# Patient Record
Sex: Male | Born: 1970 | Race: Black or African American | Hispanic: No | Marital: Married | Smoking: Never smoker
Health system: Southern US, Community
[De-identification: ages and names within clinical notes are randomized; demographics above are authoritative.]

## PROBLEM LIST (undated history)

## (undated) DIAGNOSIS — I10 Essential (primary) hypertension: Secondary | ICD-10-CM

## (undated) HISTORY — DX: Essential (primary) hypertension: I10

---

## 1993-09-09 HISTORY — PX: INGUINAL HERNIA REPAIR: SUR1180

## 2007-09-12 ENCOUNTER — Emergency Department (HOSPITAL_COMMUNITY): Admission: EM | Admit: 2007-09-12 | Discharge: 2007-09-12 | Payer: Self-pay | Admitting: *Deleted
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
## 2011-02-19 ENCOUNTER — Emergency Department: Payer: Self-pay | Admitting: Emergency Medicine

## 2013-11-12 ENCOUNTER — Encounter: Payer: Self-pay | Admitting: Podiatry

## 2013-11-12 ENCOUNTER — Ambulatory Visit: Payer: 59 | Admitting: Podiatry

## 2013-11-12 VITALS — BP 129/90 | HR 80 | Resp 16 | Ht 73.0 in | Wt 300.0 lb

## 2013-11-12 DIAGNOSIS — L600 Ingrowing nail: Secondary | ICD-10-CM

## 2013-11-12 NOTE — Progress Notes (Signed)
   Subjective:    Patient ID: Daniel Cummings, male    DOB: 12/04/1970, 43 y.o.   MRN: [id number]  HPI Comments: A couple of weeks ago i had a infected ingrown toenail went to urgent care and was put on a antibiotic.  The infection cleared up , i just want him to take a look at it. Left great lateral corner was the infected toe . Its a little tender but it is not like it was      Review of Systems  All other systems reviewed and are negative.       Objective:   Physical Exam        Assessment & Plan:

## 2013-11-12 NOTE — Patient Instructions (Signed)

## 2013-11-12 NOTE — Progress Notes (Signed)
Subjective:     Patient ID: Daniel Cummings, male   DOB: [birth year], 43 y.o.   MRN: [id number]  HPI patient presents with painful ingrown toenail left big toe that he has trims himself and there was drainage and odor that is no longer occurring but it has left him with a chronic ingrown toenail   Review of Systems  All other systems reviewed and are negative.       Objective:   Physical Exam  Nursing note and vitals reviewed. Constitutional: He is oriented to person, place, and time.  Cardiovascular: Intact distal pulses.   Musculoskeletal: Normal range of motion.  Neurological: He is oriented to person, place, and time.  Skin: Skin is warm.   neurovascular status intact with muscle strength adequate and no range of motion loss and no equinus condition noted. Patient has good Fill time and the digits are warm with incurvated left hallux lateral border that is painful when pressed and difficult for him to cut     Assessment:     Ingrown toenail deformity left hallux lateral border chronic nature    Plan:     H&P performed condition discussed and recommended permanent removal of the nail quarter. Patient wants procedure and I explained risk and today infiltrated 60 mg Xylocaine Marcaine mixture and remove the lateral quarter exposed matrix applied 3 applications of phenol 30 seconds followed by alcohol lavaged and sterile dressing. Instructed on soaks and reappoint

## 2016-09-08 DIAGNOSIS — L600 Ingrowing nail: Secondary | ICD-10-CM | POA: Diagnosis not present

## 2016-09-23 DIAGNOSIS — L03031 Cellulitis of right toe: Secondary | ICD-10-CM | POA: Diagnosis not present

## 2017-05-23 DIAGNOSIS — B999 Unspecified infectious disease: Secondary | ICD-10-CM | POA: Diagnosis not present

## 2017-06-19 DIAGNOSIS — Z6841 Body Mass Index (BMI) 40.0 and over, adult: Secondary | ICD-10-CM | POA: Diagnosis not present

## 2017-06-19 DIAGNOSIS — N501 Vascular disorders of male genital organs: Secondary | ICD-10-CM | POA: Diagnosis not present

## 2017-06-19 DIAGNOSIS — I10 Essential (primary) hypertension: Secondary | ICD-10-CM | POA: Diagnosis not present

## 2017-08-22 ENCOUNTER — Ambulatory Visit: Payer: Self-pay | Admitting: Nurse Practitioner

## 2017-09-01 ENCOUNTER — Other Ambulatory Visit: Payer: Self-pay | Admitting: Internal Medicine

## 2017-09-05 ENCOUNTER — Other Ambulatory Visit: Payer: Self-pay

## 2017-09-05 MED ORDER — HYDROCHLOROTHIAZIDE 12.5 MG PO TABS: 12.5000 mg | ORAL_TABLET | Freq: Every day | ORAL | 3 refills | Status: DC

## 2017-10-19 ENCOUNTER — Other Ambulatory Visit: Payer: Self-pay | Admitting: Internal Medicine

## 2017-10-20 DIAGNOSIS — M791 Myalgia, unspecified site: Secondary | ICD-10-CM | POA: Diagnosis not present

## 2017-10-20 DIAGNOSIS — K5289 Other specified noninfective gastroenteritis and colitis: Secondary | ICD-10-CM | POA: Diagnosis not present

## 2017-11-26 ENCOUNTER — Other Ambulatory Visit: Payer: Self-pay

## 2017-11-26 MED ORDER — VERAPAMIL HCL ER 180 MG PO TBCR: 180.0000 mg | EXTENDED_RELEASE_TABLET | Freq: Every day | ORAL | 1 refills | Status: DC

## 2017-12-16 ENCOUNTER — Other Ambulatory Visit: Payer: Self-pay | Admitting: Nurse Practitioner

## 2017-12-16 DIAGNOSIS — N529 Male erectile dysfunction, unspecified: Secondary | ICD-10-CM

## 2017-12-16 DIAGNOSIS — I10 Essential (primary) hypertension: Secondary | ICD-10-CM

## 2017-12-16 MED ORDER — HYDROCHLOROTHIAZIDE 12.5 MG PO TABS: 12.5000 mg | ORAL_TABLET | Freq: Every day | ORAL | 1 refills | Status: DC

## 2017-12-16 MED ORDER — VERAPAMIL HCL ER 180 MG PO TBCR: 180.0000 mg | EXTENDED_RELEASE_TABLET | Freq: Every day | ORAL | 1 refills | Status: DC

## 2017-12-16 MED ORDER — SILDENAFIL CITRATE 20 MG PO TABS: 20.0000 mg | ORAL_TABLET | Freq: Every day | ORAL | 1 refills | Status: DC | PRN

## 2017-12-16 NOTE — Progress Notes (Signed)
Refilled prescriptions for verapammil, HCTZ, and sildinafil for 30 days with 1 refill. Will need to be seen prior to any furhter refills. Sent all to [facility] [address].

## 2017-12-22 ENCOUNTER — Telehealth: Payer: Self-pay

## 2017-12-22 NOTE — Telephone Encounter (Signed)
Patient has been advised that Heather refilled his prescriptions for verapammil, HCTZ, and sildinafil for 30 days with 1 refill. Will need to be seen prior to any furhter refills. Sent all to [facility] [address]. Tat

## 2018-03-06 ENCOUNTER — Ambulatory Visit: Payer: 59 | Admitting: Nurse Practitioner

## 2018-03-06 ENCOUNTER — Encounter: Payer: Self-pay | Admitting: Nurse Practitioner

## 2018-03-06 VITALS — BP 146/112 | HR 102 | Resp 16 | Ht 73.0 in | Wt 318.4 lb

## 2018-03-06 DIAGNOSIS — E291 Testicular hypofunction: Secondary | ICD-10-CM

## 2018-03-06 DIAGNOSIS — Z0001 Encounter for general adult medical examination with abnormal findings: Secondary | ICD-10-CM

## 2018-03-06 DIAGNOSIS — E668 Other obesity: Secondary | ICD-10-CM

## 2018-03-06 DIAGNOSIS — E538 Deficiency of other specified B group vitamins: Secondary | ICD-10-CM

## 2018-03-06 DIAGNOSIS — I10 Essential (primary) hypertension: Secondary | ICD-10-CM

## 2018-03-06 DIAGNOSIS — Z125 Encounter for screening for malignant neoplasm of prostate: Secondary | ICD-10-CM

## 2018-03-06 MED ORDER — CYANOCOBALAMIN 1000 MCG/ML IJ SOLN
1000.0000 ug | Freq: Once | INTRAMUSCULAR | Status: AC
  Administered 2018-03-06: 1000 ug via INTRAMUSCULAR

## 2018-03-06 MED ORDER — HYDROCHLOROTHIAZIDE 12.5 MG PO TABS: 12.5000 mg | ORAL_TABLET | Freq: Every day | ORAL | 5 refills | Status: DC

## 2018-03-06 MED ORDER — VERAPAMIL HCL ER 180 MG PO TBCR: 180.0000 mg | EXTENDED_RELEASE_TABLET | Freq: Every day | ORAL | 5 refills | Status: DC

## 2018-03-06 NOTE — Progress Notes (Signed)
[facility] [address] [place], [place] [address]  Internal MEDICINE  Office Visit Note  Patient Name: Daniel Cummings  [id number]  [id number]  Date of Service: 03/27/2018   Pt is here for routine follow up.   Chief Complaint  Patient presents with  . Medication Refill    pt want to get weight loss medication. and B12 shot  . Hypertension    Hypertension  This is a chronic problem. The current episode started more than 1 year ago. The problem has been waxing and waning since onset. The problem is resistant. Pertinent negatives include no chest pain, headaches, neck pain, palpitations or shortness of breath. Risk factors for coronary artery disease include male gender and obesity. Past treatments include angiotensin blockers and diuretics. The current treatment provides mild improvement. Compliance problems include diet and exercise.        Current Medication: Outpatient Encounter Medications as of 03/06/2018  Medication Sig  . hydrochlorothiazide (HYDRODIURIL) 12.5 MG tablet Take 1 tablet (12.5 mg total) by mouth daily.  . verapamil (CALAN-SR) 180 MG CR tablet Take 1 tablet (180 mg total) by mouth daily.  . [DISCONTINUED] hydrochlorothiazide (HYDRODIURIL) 12.5 MG tablet Take 1 tablet (12.5 mg total) by mouth daily.  . [DISCONTINUED] sildenafil (REVATIO) 20 MG tablet Take 1 tablet (20 mg total) by mouth daily as needed.  . [DISCONTINUED] verapamil (CALAN-SR) 180 MG CR tablet Take 1 tablet (180 mg total) by mouth daily.  . [EXPIRED] cyanocobalamin ((VITAMIN B-12)) injection 1,000 mcg    No facility-administered encounter medications on file as of 03/06/2018.     Surgical History: History reviewed. No pertinent surgical history.  Medical History: History reviewed. No pertinent past medical history.  Family History: History reviewed. No pertinent family history.  Social History   Socioeconomic History  . Marital status: Married    Spouse name: Not on file   . Number of children: Not on file  . Years of education: Not on file  . Highest education level: Not on file  Occupational History  . Not on file  Social Needs  . Financial resource strain: Not on file  . Food insecurity:    Worry: Not on file    Inability: Not on file  . Transportation needs:    Medical: Not on file    Non-medical: Not on file  Tobacco Use  . Smoking status: Never Smoker  . Smokeless tobacco: Never Used  Substance and Sexual Activity  . Alcohol use: No  . Drug use: No  . Sexual activity: Not on file  Lifestyle  . Physical activity:    Days per week: Not on file    Minutes per session: Not on file  . Stress: Not on file  Relationships  . Social connections:    Talks on phone: Not on file    Gets together: Not on file    Attends religious service: Not on file    Active member of club or organization: Not on file    Attends meetings of clubs or organizations: Not on file    Relationship status: Not on file  . Intimate partner violence:    Fear of current or ex partner: Not on file    Emotionally abused: Not on file    Physically abused: Not on file    Forced sexual activity: Not on file  Other Topics Concern  . Not on file  Social History Narrative  . Not on file      Review of  Systems  Constitutional: Negative for activity change, chills, fatigue and unexpected weight change.  HENT: Negative for congestion, postnasal drip, rhinorrhea, sneezing and sore throat.   Eyes: Negative.  Negative for redness.  Respiratory: Negative for cough, chest tightness, shortness of breath and wheezing.   Cardiovascular: Negative for chest pain and palpitations.       Bp elevated today  Gastrointestinal: Negative for abdominal pain, constipation, diarrhea, nausea and vomiting.  Endocrine: Negative for cold intolerance, heat intolerance, polydipsia, polyphagia and polyuria.  Genitourinary: Negative for dysuria and frequency.  Musculoskeletal: Negative for  arthralgias, back pain, joint swelling and neck pain.  Skin: Negative for rash.  Allergic/Immunologic: Negative for environmental allergies.  Neurological: Negative for dizziness, tremors, numbness and headaches.  Hematological: Negative for adenopathy. Does not bruise/bleed easily.  Psychiatric/Behavioral: Negative for behavioral problems (Depression), dysphoric mood, sleep disturbance and suicidal ideas. The patient is not nervous/anxious.    Today's Vitals   03/06/18 1414  BP: (!) 146/112  Pulse: (!) 102  Resp: 16  SpO2: 94%  Weight: (!) 318 lb 6.4 oz (144.4 kg)  Height: 6\' 1"  (1.854 m)    Physical Exam  Constitutional: He is oriented to person, place, and time. He appears well-developed and well-nourished. No distress.  HENT:  Head: Normocephalic and atraumatic.  Nose: Nose normal.  Mouth/Throat: Oropharynx is clear and moist. No oropharyngeal exudate.  Eyes: Pupils are equal, round, and reactive to light. Conjunctivae and EOM are normal.  Neck: Normal range of motion. Neck supple. No JVD present. Carotid bruit is not present. No tracheal deviation present. No thyromegaly present.  Cardiovascular: Normal rate, regular rhythm and normal heart sounds. Exam reveals no gallop and no friction rub.  No murmur heard. Pulmonary/Chest: Effort normal and breath sounds normal. No respiratory distress. He has no wheezes. He has no rales. He exhibits no tenderness.  Abdominal: Soft. Bowel sounds are normal. There is no tenderness.  Musculoskeletal: Normal range of motion.  Lymphadenopathy:    He has no cervical adenopathy.  Neurological: He is alert and oriented to person, place, and time. No cranial nerve deficit.  Skin: Skin is warm and dry. Capillary refill takes less than 2 seconds. He is not diaphoretic.  Psychiatric: He has a normal mood and affect. His behavior is normal. Judgment and thought content normal.  Nursing note and vitals reviewed.  Assessment/Plan:  1. Essential  hypertension Restart verapamil CR 180mg  and HCTZ 12.5mg , both daily. Avoid excess salt intake and increase water. Recommend regular cardiovascular exercise.  - CBC with Differential/Platelet - Comprehensive metabolic panel - Lipid panel - TSH - hydrochlorothiazide (HYDRODIURIL) 12.5 MG tablet; Take 1 tablet (12.5 mg total) by mouth daily.  Dispense: 30 tablet; Refill: 5 - verapamil (CALAN-SR) 180 MG CR tablet; Take 1 tablet (180 mg total) by mouth daily.  Dispense: 30 tablet; Refill: 5  2. Testicular hypofunction Check testosterone level and treat as indicated  3. Moderate obesity Recommend following 1500 calorie diet and increased exercise. Revaluate at next visit. Consider appetite suppressant  4. B12 deficiency - cyanocobalamin ((VITAMIN B-12)) injection 1,000 mcg  5. Encounter for general adult medical examination with abnormal findings - Comprehensive metabolic panel - TSH  6. Screening for prostate cancer - PSA   General Counseling: gianmarco roye understanding of the findings of todays visit and agrees with plan of treatment. I have discussed any further diagnostic evaluation that may be needed or ordered today. We also reviewed his medications today. he has been encouraged to call the office with  any questions or concerns that should arise related to todays visit.    Counseling:  Hypertension Counseling:   The following hypertensive lifestyle modification were recommended and discussed:  1. Limiting alcohol intake to less than 1 oz/day of ethanol:(24 oz of beer or 8 oz of wine or 2 oz of 100-proof whiskey). 2. Take baby ASA 81 mg daily. 3. Importance of regular aerobic exercise and losing weight. 4. Reduce dietary saturated fat and cholesterol intake for overall cardiovascular health. 5. Maintaining adequate dietary potassium, calcium, and magnesium intake. 6. Regular monitoring of the blood pressure. 7. Reduce sodium intake to less than 100 mmol/day (less than  2.3 gm of sodium or less than 6 gm of sodium choride)    This patient was seen by Vincent GrosHeather Coleson Kant FNP Collaboration with Dr Lyndon CodeFozia M Khan as a part of collaborative care agreement  Orders Placed This Encounter  Procedures  . PSA  . CBC with Differential/Platelet  . Comprehensive metabolic panel  . Lipid panel  . TSH    Meds ordered this encounter  Medications  . hydrochlorothiazide (HYDRODIURIL) 12.5 MG tablet    Sig: Take 1 tablet (12.5 mg total) by mouth daily.    Dispense:  30 tablet    Refill:  5    Order Specific Question:   Supervising Provider    Answer:   Lyndon CodeKHAN, FOZIA M [[id number]]  . verapamil (CALAN-SR) 180 MG CR tablet    Sig: Take 1 tablet (180 mg total) by mouth daily.    Dispense:  30 tablet    Refill:  5    Order Specific Question:   Supervising Provider    Answer:   Lyndon CodeKHAN, FOZIA M [[id number]]  . cyanocobalamin ((VITAMIN B-12)) injection 1,000 mcg    Time spent: 3215 Minutes   Dr Lyndon CodeFozia M Khan Internal medicine

## 2018-03-09 ENCOUNTER — Other Ambulatory Visit: Payer: Self-pay

## 2018-03-10 ENCOUNTER — Other Ambulatory Visit: Payer: Self-pay

## 2018-03-10 DIAGNOSIS — N529 Male erectile dysfunction, unspecified: Secondary | ICD-10-CM

## 2018-03-10 MED ORDER — SILDENAFIL CITRATE 20 MG PO TABS: 20.0000 mg | ORAL_TABLET | Freq: Every day | ORAL | 1 refills | Status: DC | PRN

## 2018-03-19 ENCOUNTER — Other Ambulatory Visit: Payer: Self-pay | Admitting: Nurse Practitioner

## 2018-03-19 DIAGNOSIS — Z0001 Encounter for general adult medical examination with abnormal findings: Secondary | ICD-10-CM | POA: Diagnosis not present

## 2018-03-19 DIAGNOSIS — E782 Mixed hyperlipidemia: Secondary | ICD-10-CM | POA: Diagnosis not present

## 2018-03-19 DIAGNOSIS — I10 Essential (primary) hypertension: Secondary | ICD-10-CM | POA: Diagnosis not present

## 2018-03-20 LAB — TESTOSTERONE: Testosterone: 177 ng/dL — ABNORMAL LOW (ref 264–916)

## 2018-03-20 LAB — LIPID PANEL W/O CHOL/HDL RATIO
CHOLESTEROL TOTAL: 154 mg/dL (ref 100–199)
HDL: 40 mg/dL (ref >39)
LDL CALC: 69 mg/dL (ref 0–99)
Triglycerides: 227 mg/dL — ABNORMAL HIGH (ref 0–149)
VLDL Cholesterol Cal: 45 mg/dL — ABNORMAL HIGH (ref 5–40)

## 2018-03-20 LAB — COMPREHENSIVE METABOLIC PANEL
A/G RATIO: 1.1 — AB (ref 1.2–2.2)
ALT: 30 IU/L (ref 0–44)
AST: 30 IU/L (ref 0–40)
Albumin: 4.1 g/dL (ref 3.5–5.5)
Alkaline Phosphatase: 105 IU/L (ref 39–117)
BUN/Creatinine Ratio: 13 (ref 9–20)
BUN: 16 mg/dL (ref 6–24)
Bilirubin Total: 0.6 mg/dL (ref 0.0–1.2)
CALCIUM: 9.5 mg/dL (ref 8.7–10.2)
CO2: 24 mmol/L (ref 20–29)
Chloride: 103 mmol/L (ref 96–106)
Creatinine, Ser: 1.27 mg/dL (ref 0.76–1.27)
GFR calc Af Amer: 77 mL/min/{1.73_m2} (ref >59)
GFR, EST NON AFRICAN AMERICAN: 66 mL/min/{1.73_m2} (ref >59)
GLOBULIN, TOTAL: 3.7 g/dL (ref 1.5–4.5)
Glucose: 105 mg/dL — ABNORMAL HIGH (ref 65–99)
POTASSIUM: 4.2 mmol/L (ref 3.5–5.2)
SODIUM: 142 mmol/L (ref 134–144)
TOTAL PROTEIN: 7.8 g/dL (ref 6.0–8.5)

## 2018-03-20 LAB — PSA: Prostate Specific Ag, Serum: 0.5 ng/mL (ref 0.0–4.0)

## 2018-03-20 LAB — CBC
HEMOGLOBIN: 13.9 g/dL (ref 13.0–17.7)
Hematocrit: 40.9 % (ref 37.5–51.0)
MCH: 27.6 pg (ref 26.6–33.0)
MCHC: 34 g/dL (ref 31.5–35.7)
MCV: 81 fL (ref 79–97)
Platelets: 301 10*3/uL (ref 150–450)
RBC: 5.04 x10E6/uL (ref 4.14–5.80)
RDW: 14.7 % (ref 12.3–15.4)
WBC: 8 10*3/uL (ref 3.4–10.8)

## 2018-03-20 LAB — HGB A1C W/O EAG: Hgb A1c MFr Bld: 6 % — ABNORMAL HIGH (ref 4.8–5.6)

## 2018-03-20 LAB — TSH: TSH: 2.12 u[IU]/mL (ref 0.450–4.500)

## 2018-03-27 ENCOUNTER — Ambulatory Visit: Payer: 59 | Admitting: Adult Health

## 2018-03-27 ENCOUNTER — Encounter: Payer: Self-pay | Admitting: Adult Health

## 2018-03-27 VITALS — BP 130/98 | HR 96 | Resp 16 | Ht 73.0 in | Wt 316.2 lb

## 2018-03-27 DIAGNOSIS — Z79890 Hormone replacement therapy (postmenopausal): Secondary | ICD-10-CM

## 2018-03-27 DIAGNOSIS — E668 Other obesity: Secondary | ICD-10-CM | POA: Insufficient documentation

## 2018-03-27 DIAGNOSIS — I10 Essential (primary) hypertension: Secondary | ICD-10-CM

## 2018-03-27 DIAGNOSIS — E291 Testicular hypofunction: Secondary | ICD-10-CM | POA: Diagnosis not present

## 2018-03-27 DIAGNOSIS — E538 Deficiency of other specified B group vitamins: Secondary | ICD-10-CM | POA: Insufficient documentation

## 2018-03-27 DIAGNOSIS — Z125 Encounter for screening for malignant neoplasm of prostate: Secondary | ICD-10-CM | POA: Insufficient documentation

## 2018-03-27 MED ORDER — TESTOSTERONE 20.25 MG/ACT (1.62%) TD GEL: 1.0000 "application " | Freq: Every day | TRANSDERMAL | 0 refills | Status: DC

## 2018-03-27 NOTE — Progress Notes (Signed)
[facility] [address] [place], [place] [address]  Internal MEDICINE  Office Visit Note  Patient Name: Daniel Cummings  [id number] 22, 2072  [id number]  Date of Service: 03/29/2018  Chief Complaint  Patient presents with  . Weight Loss  . Hypertension    check bp     HPI Pt here for follow up to lab work.  He is requesting medication for weight loss.  At his last visit his blood pressure was elevated.  He reports he had consumed energy drinks before coming to the office. He is concerned about his testosterone level.     Current Medication: Outpatient Encounter Medications as of 03/27/2018  Medication Sig  . hydrochlorothiazide (HYDRODIURIL) 12.5 MG tablet Take 1 tablet (12.5 mg total) by mouth daily.  . sildenafil (REVATIO) 20 MG tablet Take 1 tablet (20 mg total) by mouth daily as needed.  . Testosterone (ANDROGEL PUMP) 20.25 MG/ACT (1.62%) GEL Place 1 application onto the skin daily.  . verapamil (CALAN-SR) 180 MG CR tablet Take 1 tablet (180 mg total) by mouth daily.   No facility-administered encounter medications on file as of 03/27/2018.     Surgical History: History reviewed. No pertinent surgical history.  Medical History: History reviewed. No pertinent past medical history.  Family History: History reviewed. No pertinent family history.  Social History   Socioeconomic History  . Marital status: Married    Spouse name: Not on file  . Number of children: Not on file  . Years of education: Not on file  . Highest education level: Not on file  Occupational History  . Not on file  Social Needs  . Financial resource strain: Not on file  . Food insecurity:    Worry: Not on file    Inability: Not on file  . Transportation needs:    Medical: Not on file    Non-medical: Not on file  Tobacco Use  . Smoking status: Never Smoker  . Smokeless tobacco: Never Used  Substance and Sexual Activity  . Alcohol use: No  . Drug use: No  . Sexual activity: Not on  file  Lifestyle  . Physical activity:    Days per week: Not on file    Minutes per session: Not on file  . Stress: Not on file  Relationships  . Social connections:    Talks on phone: Not on file    Gets together: Not on file    Attends religious service: Not on file    Active member of club or organization: Not on file    Attends meetings of clubs or organizations: Not on file    Relationship status: Not on file  . Intimate partner violence:    Fear of current or ex partner: Not on file    Emotionally abused: Not on file    Physically abused: Not on file    Forced sexual activity: Not on file  Other Topics Concern  . Not on file  Social History Narrative  . Not on file      Review of Systems  Constitutional: Negative.  Negative for chills, fatigue and unexpected weight change.  HENT: Negative.  Negative for congestion, rhinorrhea, sneezing and sore throat.   Eyes: Negative for redness.  Respiratory: Negative.  Negative for cough, chest tightness and shortness of breath.   Cardiovascular: Negative.  Negative for chest pain and palpitations.  Gastrointestinal: Negative.  Negative for abdominal pain, constipation, diarrhea, nausea and vomiting.  Endocrine: Negative.   Genitourinary: Negative.  Negative for dysuria and frequency.  Musculoskeletal: Negative.  Negative for arthralgias, back pain, joint swelling and neck pain.  Skin: Negative.  Negative for rash.  Allergic/Immunologic: Negative.   Neurological: Negative.  Negative for tremors and numbness.  Hematological: Negative for adenopathy. Does not bruise/bleed easily.  Psychiatric/Behavioral: Negative.  Negative for behavioral problems, sleep disturbance and suicidal ideas. The patient is not nervous/anxious.     Vital Signs: BP (!) 130/98   Pulse 96   Resp 16   Ht 6\' 1"  (1.854 m)   Wt (!) 316 lb 3.2 oz (143.4 kg)   SpO2 97%   BMI 41.72 kg/m    Physical Exam  Constitutional: He is oriented to person, place,  and time. He appears well-developed and well-nourished. No distress.  HENT:  Head: Normocephalic and atraumatic.  Mouth/Throat: Oropharynx is clear and moist. No oropharyngeal exudate.  Eyes: Pupils are equal, round, and reactive to light. EOM are normal.  Neck: Normal range of motion. Neck supple. No JVD present. No tracheal deviation present. No thyromegaly present.  Cardiovascular: Normal rate, regular rhythm and normal heart sounds. Exam reveals no gallop and no friction rub.  No murmur heard. Pulmonary/Chest: Effort normal and breath sounds normal. No respiratory distress. He has no wheezes. He has no rales. He exhibits no tenderness.  Abdominal: Soft. There is no tenderness. There is no guarding.  Musculoskeletal: Normal range of motion.  Lymphadenopathy:    He has no cervical adenopathy.  Neurological: He is alert and oriented to person, place, and time. No cranial nerve deficit.  Skin: Skin is warm and dry. He is not diaphoretic.  Psychiatric: He has a normal mood and affect. His behavior is normal. Judgment and thought content normal.  Nursing note and vitals reviewed.  Assessment/Plan: 1. Testicular hypofunction - Level decreased.  Will start on treatment, however was educated about worsening lipid profile and elevated ferritin and hg due to testosterone injection, pt will need serial levels, pt will also need prolactin level  - Testosterone (ANDROGEL PUMP) 20.25 MG/ACT (1.62%) GEL; Place 1 application onto the skin daily.  Dispense: 75 g; Refill: 0  2. Essential hypertension Better controlled at this visit.  Maintain current regimen, and follow up with Heather for continued mgmt.   General Counseling: neilson oehlert understanding of the findings of todays visit and agrees with plan of treatment. I have discussed any further diagnostic evaluation that may be needed or ordered today. We also reviewed his medications today. he has been encouraged to call the office with any  questions or concerns that should arise related to todays visit.    Orders Placed This Encounter  Procedures  . Ferritin  . Testosterone,Free and Total  . Prolactin    Meds ordered this encounter  Medications  . Testosterone (ANDROGEL PUMP) 20.25 MG/ACT (1.62%) GEL    Sig: Place 1 application onto the skin daily.    Dispense:  75 g    Refill:  0    Time spent: 25 Minutes   This patient was seen by Blima Ledger AGNP-C in Collaboration with Dr Lyndon Code as a part of collaborative care agreement    Dr Lyndon Code Internal medicine

## 2018-05-12 ENCOUNTER — Other Ambulatory Visit: Payer: Self-pay

## 2018-05-12 DIAGNOSIS — N529 Male erectile dysfunction, unspecified: Secondary | ICD-10-CM

## 2018-05-12 DIAGNOSIS — I10 Essential (primary) hypertension: Secondary | ICD-10-CM

## 2018-05-12 MED ORDER — SILDENAFIL CITRATE 20 MG PO TABS: 20.0000 mg | ORAL_TABLET | Freq: Every day | ORAL | 1 refills | Status: DC | PRN

## 2018-05-29 ENCOUNTER — Ambulatory Visit: Payer: Self-pay | Admitting: Adult Health

## 2018-08-04 ENCOUNTER — Ambulatory Visit: Payer: 59 | Admitting: Nurse Practitioner

## 2018-08-04 ENCOUNTER — Encounter: Payer: Self-pay | Admitting: Nurse Practitioner

## 2018-08-04 VITALS — BP 177/99 | HR 119 | Temp 99.0°F | Resp 16 | Ht 73.0 in | Wt 314.8 lb

## 2018-08-04 DIAGNOSIS — R000 Tachycardia, unspecified: Secondary | ICD-10-CM | POA: Diagnosis not present

## 2018-08-04 DIAGNOSIS — L03211 Cellulitis of face: Secondary | ICD-10-CM | POA: Diagnosis not present

## 2018-08-04 DIAGNOSIS — K1379 Other lesions of oral mucosa: Secondary | ICD-10-CM

## 2018-08-04 DIAGNOSIS — I10 Essential (primary) hypertension: Secondary | ICD-10-CM | POA: Diagnosis not present

## 2018-08-04 DIAGNOSIS — N529 Male erectile dysfunction, unspecified: Secondary | ICD-10-CM

## 2018-08-04 MED ORDER — SILDENAFIL CITRATE 20 MG PO TABS: 20.0000 mg | ORAL_TABLET | Freq: Every day | ORAL | 1 refills | Status: DC | PRN

## 2018-08-04 MED ORDER — LIDOCAINE VISCOUS HCL 2 % MT SOLN: OROMUCOSAL | 0 refills | Status: DC

## 2018-08-04 MED ORDER — SULFAMETHOXAZOLE-TRIMETHOPRIM 800-160 MG PO TABS: 1.0000 | ORAL_TABLET | Freq: Two times a day (BID) | ORAL | 0 refills | Status: DC

## 2018-08-04 NOTE — Progress Notes (Signed)
[facility] [address] [place], [place] [address]  Internal MEDICINE  Office Visit Note  Patient Name: Daniel Cummings  [id number]  [id number]  Date of Service: 08/08/2018   Pt is here for a sick visit.   Chief Complaint  Patient presents with  . Recurrent Skin Infections    hair bump, boil like on tip of nose and has lip swollen. noticed the bump last night, warm to the touch, pt also mentioned that he is having cold symptoms  . Medical Management of Chronic Issues    medication refill     The patient is c/o large boil type lesion just under his nose, stretching down to the upper lip. Noted this first thing this morning. This is very tender. Skin is intact and there is no drainage which he has noted. He is tachycardic and blood pressure elevated today. He has noted some body aches but has no fever or chills.        Current Medication:  Outpatient Encounter Medications as of 08/04/2018  Medication Sig  . hydrochlorothiazide (HYDRODIURIL) 12.5 MG tablet Take 1 tablet (12.5 mg total) by mouth daily.  Marland Kitchen lidocaine (XYLOCAINE) 2 % solution Apply very small amount to affected area four times daily as needed for pain  . sildenafil (REVATIO) 20 MG tablet Take 1 tablet (20 mg total) by mouth daily as needed.  . sulfamethoxazole-trimethoprim (BACTRIM DS,SEPTRA DS) 800-160 MG tablet Take 1 tablet by mouth 2 (two) times daily.  . Testosterone (ANDROGEL PUMP) 20.25 MG/ACT (1.62%) GEL Place 1 application onto the skin daily.  . verapamil (CALAN-SR) 180 MG CR tablet Take 1 tablet (180 mg total) by mouth daily.  . [DISCONTINUED] sildenafil (REVATIO) 20 MG tablet Take 1 tablet (20 mg total) by mouth daily as needed.   No facility-administered encounter medications on file as of 08/04/2018.       Medical History: Past Medical History:  Diagnosis Date  . Hypertension     Today's Vitals   08/04/18 1445  BP: (!) 177/99  Pulse: (!) 119  Resp: 16  Temp: 99 F (37.2  C)  SpO2: 95%  Weight: (!) 314 lb 12.8 oz (142.8 kg)  Height: 6\' 1"  (1.854 m)    Review of Systems  Constitutional: Positive for chills and fatigue. Negative for unexpected weight change.  HENT: Negative for congestion, postnasal drip, rhinorrhea, sneezing and sore throat.   Respiratory: Negative for cough, chest tightness, shortness of breath and wheezing.   Cardiovascular: Negative for chest pain and palpitations.  Endocrine: Negative for cold intolerance, heat intolerance, polydipsia and polyuria.  Musculoskeletal: Negative for arthralgias, back pain, joint swelling and neck pain.  Skin: Negative for rash.       Tender palpable lesion just under the nose, stretching to above the upper lip. Noted this morning. Has gradually become larger as the day goes on.   Neurological: Negative.  Negative for tremors and numbness.  Hematological: Negative for adenopathy. Does not bruise/bleed easily.  Psychiatric/Behavioral: Negative for behavioral problems (Depression), sleep disturbance and suicidal ideas. The patient is not nervous/anxious.     Physical Exam  Constitutional: He is oriented to person, place, and time. He appears well-developed and well-nourished. No distress.  HENT:  Head: Normocephalic and atraumatic.  Mouth/Throat: No oropharyngeal exudate.  Eyes: Pupils are equal, round, and reactive to light. EOM are normal.  Neck: Normal range of motion. Neck supple. No JVD present. No tracheal deviation present. No thyromegaly present.  Cardiovascular: Normal rate, regular rhythm  and normal heart sounds. Exam reveals no gallop and no friction rub.  No murmur heard. Mild tachycardia  Pulmonary/Chest: Effort normal and breath sounds normal. No respiratory distress. He has no wheezes. He has no rales. He exhibits no tenderness.  Musculoskeletal: Normal range of motion.  Lymphadenopathy:    He has cervical adenopathy.  Neurological: He is alert and oriented to person, place, and time. No  cranial nerve deficit.  Skin: Skin is warm and dry. He is not diaphoretic.     Psychiatric: He has a normal mood and affect. His behavior is normal. Judgment and thought content normal.  Nursing note and vitals reviewed.  Assessment/Plan: 1. Cellulitis of face Start patient on bactrim DS bid for 10 days. Recommend warm compress on the area in 15 minute intervals in order to encourage lesion to drain.  - sulfamethoxazole-trimethoprim (BACTRIM DS,SEPTRA DS) 800-160 MG tablet; Take 1 tablet by mouth 2 (two) times daily.  Dispense: 20 tablet; Refill: 0  2. Mouth pain May apply small amount of viscous lidocaine to affected area up to four times daily to help relieve pain. May also take NSAIDs and/or tylenol to help reduce pain.  - lidocaine (XYLOCAINE) 2 % solution; Apply very small amount to affected area four times daily as needed for pain  Dispense: 20 mL; Refill: 0  3. Essential hypertension Generally stable. Continue to monitor.   4. Tachycardia Likely from new infection. Will continue to monitor.   5. Erectile dysfunction, unspecified erectile dysfunction type May conitnue viagra as needed and as prescribed. Refills provided today.  - sildenafil (REVATIO) 20 MG tablet; Take 1 tablet (20 mg total) by mouth daily as needed.  Dispense: 30 tablet; Refill: 1  General Counseling: Daniel Cummings verbalizes understanding of the findings of todays visit and agrees with plan of treatment. I have discussed any further diagnostic evaluation that may be needed or ordered today. We also reviewed his medications today. he has been encouraged to call the office with any questions or concerns that should arise related to todays visit.    Counseling:  Hypertension Counseling:   The following hypertensive lifestyle modification were recommended and discussed:  1. Limiting alcohol intake to less than 1 oz/day of ethanol:(24 oz of beer or 8 oz of wine or 2 oz of 100-proof whiskey). 2. Take baby ASA 81 mg  daily. 3. Importance of regular aerobic exercise and losing weight. 4. Reduce dietary saturated fat and cholesterol intake for overall cardiovascular health. 5. Maintaining adequate dietary potassium, calcium, and magnesium intake. 6. Regular monitoring of the blood pressure. 7. Reduce sodium intake to less than 100 mmol/day (less than 2.3 gm of sodium or less than 6 gm of sodium choride)   Rest and increase fluids. Continue using OTC medication to control symptoms.   This patient was seen by Vincent Gros FNP Collaboration with Dr Lyndon Code as a part of collaborative care agreement  Meds ordered this encounter  Medications  . sulfamethoxazole-trimethoprim (BACTRIM DS,SEPTRA DS) 800-160 MG tablet    Sig: Take 1 tablet by mouth 2 (two) times daily.    Dispense:  20 tablet    Refill:  0    Order Specific Question:   Supervising Provider    Answer:   Lyndon Code [[id number]]  . lidocaine (XYLOCAINE) 2 % solution    Sig: Apply very small amount to affected area four times daily as needed for pain    Dispense:  20 mL    Refill:  0  Order Specific Question:   Supervising Provider    Answer:   Lyndon CodeKHAN, FOZIA M [[id number]]  . sildenafil (REVATIO) 20 MG tablet    Sig: Take 1 tablet (20 mg total) by mouth daily as needed.    Dispense:  30 tablet    Refill:  1    Order Specific Question:   Supervising Provider    Answer:   Lyndon CodeKHAN, FOZIA M [[id number]]    Time spent: 25 Minutes

## 2018-08-08 DIAGNOSIS — R000 Tachycardia, unspecified: Secondary | ICD-10-CM | POA: Insufficient documentation

## 2018-08-08 DIAGNOSIS — N529 Male erectile dysfunction, unspecified: Secondary | ICD-10-CM | POA: Insufficient documentation

## 2018-08-08 DIAGNOSIS — L03211 Cellulitis of face: Secondary | ICD-10-CM | POA: Insufficient documentation

## 2018-08-08 DIAGNOSIS — K1379 Other lesions of oral mucosa: Secondary | ICD-10-CM | POA: Insufficient documentation

## 2018-09-14 ENCOUNTER — Other Ambulatory Visit: Payer: Self-pay

## 2018-09-14 DIAGNOSIS — I10 Essential (primary) hypertension: Secondary | ICD-10-CM

## 2018-09-14 MED ORDER — VERAPAMIL HCL ER 180 MG PO TBCR: 180.0000 mg | EXTENDED_RELEASE_TABLET | Freq: Every day | ORAL | 5 refills | Status: DC

## 2018-10-13 ENCOUNTER — Other Ambulatory Visit: Payer: Self-pay

## 2018-10-13 DIAGNOSIS — N529 Male erectile dysfunction, unspecified: Secondary | ICD-10-CM

## 2018-10-13 MED ORDER — SILDENAFIL CITRATE 20 MG PO TABS: 20.0000 mg | ORAL_TABLET | Freq: Every day | ORAL | 0 refills | Status: DC | PRN

## 2018-12-23 ENCOUNTER — Other Ambulatory Visit: Payer: Self-pay

## 2018-12-23 ENCOUNTER — Encounter: Payer: Self-pay | Admitting: Nurse Practitioner

## 2018-12-23 ENCOUNTER — Ambulatory Visit: Payer: BLUE CROSS/BLUE SHIELD | Admitting: Nurse Practitioner

## 2018-12-23 VITALS — Ht 73.0 in | Wt 315.0 lb

## 2018-12-23 DIAGNOSIS — E291 Testicular hypofunction: Secondary | ICD-10-CM | POA: Diagnosis not present

## 2018-12-23 DIAGNOSIS — E668 Other obesity: Secondary | ICD-10-CM

## 2018-12-23 DIAGNOSIS — N529 Male erectile dysfunction, unspecified: Secondary | ICD-10-CM | POA: Diagnosis not present

## 2018-12-23 DIAGNOSIS — I10 Essential (primary) hypertension: Secondary | ICD-10-CM | POA: Diagnosis not present

## 2018-12-23 MED ORDER — SILDENAFIL CITRATE 20 MG PO TABS: 20.0000 mg | ORAL_TABLET | Freq: Every day | ORAL | 3 refills | Status: DC | PRN

## 2018-12-23 MED ORDER — HYDROCHLOROTHIAZIDE 12.5 MG PO TABS: 12.5000 mg | ORAL_TABLET | Freq: Every day | ORAL | 3 refills | Status: DC

## 2018-12-23 MED ORDER — VERAPAMIL HCL ER 180 MG PO TBCR: 180.0000 mg | EXTENDED_RELEASE_TABLET | Freq: Every day | ORAL | 3 refills | Status: DC

## 2018-12-23 MED ORDER — TESTOSTERONE 20.25 MG/ACT (1.62%) TD GEL: 1.0000 "application " | Freq: Every day | TRANSDERMAL | 2 refills | Status: DC

## 2018-12-23 NOTE — Progress Notes (Signed)
[facility] [address] [place], [place] [address]  Internal MEDICINE  Telephone Visit  Patient Name: Daniel Cummings  [id number]  [id number]  Date of Service: 01/17/2019  I connected with the patient at 11:00am by webcam and verified the patients identity using two identifiers.   I discussed the limitations, risks, security and privacy concerns of performing an evaluation and management service by webcam and the availability of in person appointments. I also discussed with the patient that there may be a patient responsible charge related to the service.  The patient expressed understanding and agrees to proceed.    Chief Complaint  Patient presents with  . Medical Management of Chronic Issues    medication refills  . Hypertension    The patient has been contacted via webcam for follow up visit due to concerns for spread of novel coronavirus.   Hypertension  This is a chronic problem. The problem is controlled. Pertinent negatives include no chest pain, headaches, neck pain, palpitations or shortness of breath. Agents associated with hypertension include steroids. Risk factors for coronary artery disease include obesity. Past treatments include calcium channel blockers and diuretics. The current treatment provides moderate improvement. Compliance problems include exercise.        Current Medication: Outpatient Encounter Medications as of 12/23/2018  Medication Sig  . hydrochlorothiazide (HYDRODIURIL) 12.5 MG tablet Take 1 tablet (12.5 mg total) by mouth daily.  Marland Kitchen lidocaine (XYLOCAINE) 2 % solution Apply very small amount to affected area four times daily as needed for pain  . sildenafil (REVATIO) 20 MG tablet Take 1 tablet (20 mg total) by mouth daily as needed.  . Testosterone (ANDROGEL PUMP) 20.25 MG/ACT (1.62%) GEL Place 1 application onto the skin daily.  . verapamil (CALAN-SR) 180 MG CR tablet Take 1 tablet (180 mg total) by mouth daily.  . [DISCONTINUED]  hydrochlorothiazide (HYDRODIURIL) 12.5 MG tablet Take 1 tablet (12.5 mg total) by mouth daily.  . [DISCONTINUED] sildenafil (REVATIO) 20 MG tablet Take 1 tablet (20 mg total) by mouth daily as needed.  . [DISCONTINUED] Testosterone (ANDROGEL PUMP) 20.25 MG/ACT (1.62%) GEL Place 1 application onto the skin daily.  . [DISCONTINUED] verapamil (CALAN-SR) 180 MG CR tablet Take 1 tablet (180 mg total) by mouth daily.  Marland Kitchen sulfamethoxazole-trimethoprim (BACTRIM DS,SEPTRA DS) 800-160 MG tablet Take 1 tablet by mouth 2 (two) times daily. (Patient not taking: Reported on 12/23/2018)   No facility-administered encounter medications on file as of 12/23/2018.     Surgical History: Past Surgical History:  Procedure Laterality Date  . INGUINAL HERNIA REPAIR Left 1995    Medical History: Past Medical History:  Diagnosis Date  . Hypertension     Family History: Family History  Problem Relation Age of Onset  . Coronary artery disease Neg Hx   . Diabetes Neg Hx   . Hypertension Neg Hx     Social History   Socioeconomic History  . Marital status: Married    Spouse name: Not on file  . Number of children: Not on file  . Years of education: Not on file  . Highest education level: Not on file  Occupational History  . Not on file  Social Needs  . Financial resource strain: Not on file  . Food insecurity:    Worry: Not on file    Inability: Not on file  . Transportation needs:    Medical: Not on file    Non-medical: Not on file  Tobacco Use  . Smoking status: Never Smoker  .  Smokeless tobacco: Never Used  Substance and Sexual Activity  . Alcohol use: No  . Drug use: No  . Sexual activity: Not on file  Lifestyle  . Physical activity:    Days per week: Not on file    Minutes per session: Not on file  . Stress: Not on file  Relationships  . Social connections:    Talks on phone: Not on file    Gets together: Not on file    Attends religious service: Not on file    Active member of  club or organization: Not on file    Attends meetings of clubs or organizations: Not on file    Relationship status: Not on file  . Intimate partner violence:    Fear of current or ex partner: Not on file    Emotionally abused: Not on file    Physically abused: Not on file    Forced sexual activity: Not on file  Other Topics Concern  . Not on file  Social History Narrative  . Not on file      Review of Systems  Constitutional: Negative for chills, fatigue and unexpected weight change.  HENT: Negative for congestion, rhinorrhea, sneezing and sore throat.   Respiratory: Negative for cough, chest tightness, shortness of breath and wheezing.   Cardiovascular: Negative for chest pain and palpitations.  Gastrointestinal: Negative for abdominal pain, constipation, diarrhea, nausea and vomiting.  Endocrine: Negative for cold intolerance, heat intolerance, polydipsia and polyuria.  Musculoskeletal: Negative for arthralgias, back pain, joint swelling and neck pain.  Skin: Negative for rash.  Allergic/Immunologic: Negative.   Neurological: Negative for dizziness, tremors, numbness and headaches.  Hematological: Negative for adenopathy. Does not bruise/bleed easily.  Psychiatric/Behavioral: Negative for behavioral problems, sleep disturbance and suicidal ideas. The patient is not nervous/anxious.     Today's Vitals   12/23/18 1056  Weight: (!) 315 lb (142.9 kg)  Height:  (1.854 m)   Body mass index is 41.56 kg/m.  Observation/Objective:  The patient is alert and oriented. He is pleasant and answering all questions appropriately. Breathing is non-labored. He is in no acute distress.    Assessment/Plan: 1. Essential hypertension Improved and stable. Continue verapamil and HCTZ as prescribed. Refills were provided today.  - hydrochlorothiazide (HYDRODIURIL) 12.5 MG tablet; Take 1 tablet (12.5 mg total) by mouth daily.  Dispense: 30 tablet; Refill: 3 - verapamil (CALAN-SR) 180 MG  CR tablet; Take 1 tablet (180 mg total) by mouth daily.  Dispense: 30 tablet; Refill: 3  2. Erectile dysfunction, unspecified erectile dysfunction type May take sildenafil as previously prescribed. Refills provided today.  - sildenafil (REVATIO) 20 MG tablet; Take 1 tablet (20 mg total) by mouth daily as needed.  Dispense: 30 tablet; Refill: 3  3. Testicular hypofunction May continue androgel pump as prescribed. Will have testosterone level, along with lipid panel, HgbA1c, and prolactin level for further evaluation.  - Testosterone (ANDROGEL PUMP) 20.25 MG/ACT (1.62%) GEL; Place 1 application onto the skin daily.  DispenHgbse: 75 g; Refill: 2  4. Moderate obesity Will provide patient with menus for 1800 amd 2000 calorie diets. Encouraged him to follow these closely and incorporate exercise into his daily routine .  General Counseling: karas pickerill understanding of the findings of today's phone visit and agrees with plan of treatment. I have discussed any further diagnostic evaluation that may be needed or ordered today. We also reviewed his medications today. he has been encouraged to call the office with any questions or concerns that  should arise related to todays visit.  Hypertension Counseling:   The following hypertensive lifestyle modification were recommended and discussed:  1. Limiting alcohol intake to less than 1 oz/day of ethanol:(24 oz of beer or 8 oz of wine or 2 oz of 100-proof whiskey). 2. Take baby ASA 81 mg daily. 3. Importance of regular aerobic exercise and losing weight. 4. Reduce dietary saturated fat and cholesterol intake for overall cardiovascular health. 5. Maintaining adequate dietary potassium, calcium, and magnesium intake. 6. Regular monitoring of the blood pressure. 7. Reduce sodium intake to less than 100 mmol/day (less than 2.3 gm of sodium or less than 6 gm of sodium choride)   This patient was seen by Vincent GrosHeather Rilan Eiland FNP Collaboration with Dr Lyndon CodeFozia M  Khan as a part of collaborative care agreement  Meds ordered this encounter  Medications  . hydrochlorothiazide (HYDRODIURIL) 12.5 MG tablet    Sig: Take 1 tablet (12.5 mg total) by mouth daily.    Dispense:  30 tablet    Refill:  3    Order Specific Question:   Supervising Provider    Answer:   Lyndon CodeKHAN, FOZIA M [[id number]]  . sildenafil (REVATIO) 20 MG tablet    Sig: Take 1 tablet (20 mg total) by mouth daily as needed.    Dispense:  30 tablet    Refill:  3    Order Specific Question:   Supervising Provider    Answer:   Lyndon CodeKHAN, FOZIA M [[id number]]  . verapamil (CALAN-SR) 180 MG CR tablet    Sig: Take 1 tablet (180 mg total) by mouth daily.    Dispense:  30 tablet    Refill:  3    Order Specific Question:   Supervising Provider    Answer:   Lyndon CodeKHAN, FOZIA M [[id number]]  . Testosterone (ANDROGEL PUMP) 20.25 MG/ACT (1.62%) GEL    Sig: Place 1 application onto the skin daily.    Dispense:  75 g    Refill:  2    Order Specific Question:   Supervising Provider    Answer:   Lyndon CodeKHAN, FOZIA M [[id number]]    Time spent: 6820 Minutes    Dr Lyndon CodeFozia M Khan Internal medicine

## 2018-12-25 ENCOUNTER — Telehealth: Payer: Self-pay | Admitting: Nurse Practitioner

## 2018-12-25 NOTE — Telephone Encounter (Signed)
PRIOR AUTHORIZATION FOR MEDICATION Testosterone 20.25 MG/ACT(1.62%) gel HAS BEEN APPROVED BY INSURANCE  Outcome Approvedon April 16 Effective from 12/24/2018 through 09/08/2038. PHARMACY CONTACTED

## 2019-03-30 ENCOUNTER — Encounter: Payer: BLUE CROSS/BLUE SHIELD | Admitting: Nurse Practitioner

## 2019-04-20 ENCOUNTER — Other Ambulatory Visit: Payer: Self-pay

## 2019-04-20 DIAGNOSIS — Z20822 Contact with and (suspected) exposure to covid-19: Secondary | ICD-10-CM

## 2019-04-21 LAB — NOVEL CORONAVIRUS, NAA: SARS-CoV-2, NAA: NOT DETECTED

## 2019-04-29 ENCOUNTER — Other Ambulatory Visit: Payer: Self-pay | Admitting: Nurse Practitioner

## 2019-04-29 DIAGNOSIS — N529 Male erectile dysfunction, unspecified: Secondary | ICD-10-CM

## 2019-04-29 MED ORDER — SILDENAFIL CITRATE 20 MG PO TABS: 20.0000 mg | ORAL_TABLET | Freq: Every day | ORAL | 0 refills | Status: DC | PRN

## 2019-08-10 ENCOUNTER — Other Ambulatory Visit: Payer: Self-pay

## 2019-08-10 ENCOUNTER — Encounter: Payer: Self-pay | Admitting: Nurse Practitioner

## 2019-08-10 ENCOUNTER — Ambulatory Visit: Payer: BC Managed Care – PPO | Admitting: Nurse Practitioner

## 2019-08-10 DIAGNOSIS — N529 Male erectile dysfunction, unspecified: Secondary | ICD-10-CM

## 2019-08-10 DIAGNOSIS — E291 Testicular hypofunction: Secondary | ICD-10-CM

## 2019-08-10 DIAGNOSIS — I10 Essential (primary) hypertension: Secondary | ICD-10-CM | POA: Diagnosis not present

## 2019-08-10 DIAGNOSIS — L03811 Cellulitis of head [any part, except face]: Secondary | ICD-10-CM | POA: Diagnosis not present

## 2019-08-10 MED ORDER — VERAPAMIL HCL ER 180 MG PO TBCR: 180.0000 mg | EXTENDED_RELEASE_TABLET | Freq: Every day | ORAL | 3 refills | Status: DC

## 2019-08-10 MED ORDER — TESTOSTERONE 20.25 MG/ACT (1.62%) TD GEL: TRANSDERMAL | 2 refills | Status: DC

## 2019-08-10 MED ORDER — SILDENAFIL CITRATE 20 MG PO TABS: 20.0000 mg | ORAL_TABLET | Freq: Every day | ORAL | 0 refills | Status: DC | PRN

## 2019-08-10 MED ORDER — SULFAMETHOXAZOLE-TRIMETHOPRIM 800-160 MG PO TABS: 1.0000 | ORAL_TABLET | Freq: Two times a day (BID) | ORAL | 0 refills | Status: DC

## 2019-08-10 MED ORDER — HYDROCHLOROTHIAZIDE 12.5 MG PO TABS: 12.5000 mg | ORAL_TABLET | Freq: Every day | ORAL | 3 refills | Status: DC

## 2019-08-10 NOTE — Progress Notes (Signed)
[facility] [address] [place], [place] [address]  Internal MEDICINE  Telephone Visit  Patient Name: Daniel Cummings  [id number]  [id number]  Date of Service: 08/10/2019  I connected with the patient at 11:00am by webcam and verified the patients identity using two identifiers.   I discussed the limitations, risks, security and privacy concerns of performing an evaluation and management service by webcam and the availability of in person appointments. I also discussed with the patient that there may be a patient responsible charge related to the service.  The patient expressed understanding and agrees to proceed.    Chief Complaint  Patient presents with  . Telephone Assessment  . Telephone Screen  . Hypertension    The patient has been contacted via webcam for follow up visit due to concerns for spread of novel coronavirus. He has good control of his blood pressure. Needs to have refills of blood pressure medication. States that he is has lesions at the base of his scalp, at the hairline. This happens to him, intermittently, after getting hair cut. Will take round of Bactrim DS and this usually clears the infection.  Also does not feel that testosterone dosage is effective. States that he has been using the current dose for past few months. No improvement in symptoms. He is due to have new check of testosterone levels.       Current Medication: Outpatient Encounter Medications as of 08/10/2019  Medication Sig  . hydrochlorothiazide (HYDRODIURIL) 12.5 MG tablet Take 1 tablet (12.5 mg total) by mouth daily.  Marland Kitchen lidocaine (XYLOCAINE) 2 % solution Apply very small amount to affected area four times daily as needed for pain  . sildenafil (REVATIO) 20 MG tablet Take 1 tablet (20 mg total) by mouth daily as needed.  . Testosterone (ANDROGEL PUMP) 20.25 MG/ACT (1.62%) GEL Use two pumps under each arm daily.  . verapamil (CALAN-SR) 180 MG CR tablet Take 1 tablet (180 mg total)  by mouth daily.  . [DISCONTINUED] hydrochlorothiazide (HYDRODIURIL) 12.5 MG tablet Take 1 tablet (12.5 mg total) by mouth daily.  . [DISCONTINUED] sildenafil (REVATIO) 20 MG tablet Take 1 tablet (20 mg total) by mouth daily as needed.  . [DISCONTINUED] Testosterone (ANDROGEL PUMP) 20.25 MG/ACT (1.62%) GEL Place 1 application onto the skin daily.  . [DISCONTINUED] verapamil (CALAN-SR) 180 MG CR tablet Take 1 tablet (180 mg total) by mouth daily.  Marland Kitchen sulfamethoxazole-trimethoprim (BACTRIM DS) 800-160 MG tablet Take 1 tablet by mouth 2 (two) times daily.  . [DISCONTINUED] sulfamethoxazole-trimethoprim (BACTRIM DS,SEPTRA DS) 800-160 MG tablet Take 1 tablet by mouth 2 (two) times daily. (Patient not taking: Reported on 12/23/2018)   No facility-administered encounter medications on file as of 08/10/2019.     Surgical History: Past Surgical History:  Procedure Laterality Date  . INGUINAL HERNIA REPAIR Left 1995    Medical History: Past Medical History:  Diagnosis Date  . Hypertension     Family History: Family History  Problem Relation Age of Onset  . Coronary artery disease Neg Hx   . Diabetes Neg Hx   . Hypertension Neg Hx     Social History   Socioeconomic History  . Marital status: Married    Spouse name: Not on file  . Number of children: Not on file  . Years of education: Not on file  . Highest education level: Not on file  Occupational History  . Not on file  Social Needs  . Financial resource strain: Not on file  . Food  insecurity    Worry: Not on file    Inability: Not on file  . Transportation needs    Medical: Not on file    Non-medical: Not on file  Tobacco Use  . Smoking status: Never Smoker  . Smokeless tobacco: Never Used  Substance and Sexual Activity  . Alcohol use: No  . Drug use: No  . Sexual activity: Not on file  Lifestyle  . Physical activity    Days per week: Not on file    Minutes per session: Not on file  . Stress: Not on file   Relationships  . Social Herbalist on phone: Not on file    Gets together: Not on file    Attends religious service: Not on file    Active member of club or organization: Not on file    Attends meetings of clubs or organizations: Not on file    Relationship status: Not on file  . Intimate partner violence    Fear of current or ex partner: Not on file    Emotionally abused: Not on file    Physically abused: Not on file    Forced sexual activity: Not on file  Other Topics Concern  . Not on file  Social History Narrative  . Not on file      Review of Systems  Constitutional: Positive for fatigue. Negative for chills and unexpected weight change.  HENT: Negative for congestion, rhinorrhea, sneezing and sore throat.   Respiratory: Negative for cough, chest tightness, shortness of breath and wheezing.   Cardiovascular: Negative for chest pain and palpitations.       Good blood pressure control.   Gastrointestinal: Negative for abdominal pain, constipation, diarrhea, nausea and vomiting.  Endocrine: Negative for cold intolerance, heat intolerance, polydipsia and polyuria.  Musculoskeletal: Negative for arthralgias, back pain, joint swelling and neck pain.  Skin: Negative for rash.       Rash/lesions at the base of the scalp, near the hairline. Happens to him frequently after getting hair cut  Allergic/Immunologic: Negative.   Neurological: Negative for dizziness, tremors, numbness and headaches.  Hematological: Negative for adenopathy. Does not bruise/bleed easily.  Psychiatric/Behavioral: Negative for behavioral problems, sleep disturbance and suicidal ideas. The patient is not nervous/anxious.     Today's Vitals   08/10/19 1020  BP: 140/90  Pulse: 66  Temp: 98.6 F (37 C)  Weight: (!) 309 lb (140.2 kg)   Body mass index is 40.77 kg/m.  Observation/Objective:  The patient is alert and oriented. He is pleasant and answering all questions appropriately.  Breathing is non-labored. He is in no acute distress.    Assessment/Plan:  1. Essential hypertension Blood pressure well managed. Continue verapamil and HCTZ as prescribed. Refills provided today.  - verapamil (CALAN-SR) 180 MG CR tablet; Take 1 tablet (180 mg total) by mouth daily.  Dispense: 30 tablet; Refill: 3 - hydrochlorothiazide (HYDRODIURIL) 12.5 MG tablet; Take 1 tablet (12.5 mg total) by mouth daily.  Dispense: 30 tablet; Refill: 3  2. Testicular hypofunction Increase androgel dosing to two pumps daily under each arm. Check testosterone levels and adjust medication as indicated. Consider referral to urology as indicated.  - Testosterone (ANDROGEL PUMP) 20.25 MG/ACT (1.62%) GEL; Use two pumps under each arm daily.  Dispense: 75 g; Refill: 2  3. Erectile dysfunction, unspecified erectile dysfunction type May continue sildenafil as needed and as prescribed  - sildenafil (REVATIO) 20 MG tablet; Take 1 tablet (20 mg total) by mouth daily  as needed.  Dispense: 30 tablet; Refill: 0  4. Cellulitis of head except face Bactrim DS twice daily.  - sulfamethoxazole-trimethoprim (BACTRIM DS) 800-160 MG tablet; Take 1 tablet by mouth 2 (two) times daily.  Dispense: 20 tablet; Refill: 0  General Counseling: Gerlene BurdockBenjamin verbalizes understanding of the findings of today's phone visit and agrees with plan of treatment. I have discussed any further diagnostic evaluation that may be needed or ordered today. We also reviewed his medications today. he has been encouraged to call the office with any questions or concerns that should arise related to todays visit.   Hypertension Counseling:   The following hypertensive lifestyle modification were recommended and discussed:  1. Limiting alcohol intake to less than 1 oz/day of ethanol:(24 oz of beer or 8 oz of wine or 2 oz of 100-proof whiskey). 2. Take baby ASA 81 mg daily. 3. Importance of regular aerobic exercise and losing weight. 4. Reduce dietary  saturated fat and cholesterol intake for overall cardiovascular health. 5. Maintaining adequate dietary potassium, calcium, and magnesium intake. 6. Regular monitoring of the blood pressure. 7. Reduce sodium intake to less than 100 mmol/day (less than 2.3 gm of sodium or less than 6 gm of sodium choride)   This patient was seen by Vincent GrosHeather Kaydn Kumpf FNP Collaboration with Dr Lyndon CodeFozia M Khan as a part of collaborative care agreement  Meds ordered this encounter  Medications  . Testosterone (ANDROGEL PUMP) 20.25 MG/ACT (1.62%) GEL    Sig: Use two pumps under each arm daily.    Dispense:  75 g    Refill:  2    Order Specific Question:   Supervising Provider    Answer:   Lyndon CodeKHAN, FOZIA M [[id number]]  . sulfamethoxazole-trimethoprim (BACTRIM DS) 800-160 MG tablet    Sig: Take 1 tablet by mouth 2 (two) times daily.    Dispense:  20 tablet    Refill:  0    Order Specific Question:   Supervising Provider    Answer:   Lyndon CodeKHAN, FOZIA M [[id number]]  . sildenafil (REVATIO) 20 MG tablet    Sig: Take 1 tablet (20 mg total) by mouth daily as needed.    Dispense:  30 tablet    Refill:  0    Pt needs apt for further refills    Order Specific Question:   Supervising Provider    Answer:   Lyndon CodeKHAN, FOZIA M [[id number]]  . verapamil (CALAN-SR) 180 MG CR tablet    Sig: Take 1 tablet (180 mg total) by mouth daily.    Dispense:  30 tablet    Refill:  3    Order Specific Question:   Supervising Provider    Answer:   Lyndon CodeKHAN, FOZIA M [[id number]]  . hydrochlorothiazide (HYDRODIURIL) 12.5 MG tablet    Sig: Take 1 tablet (12.5 mg total) by mouth daily.    Dispense:  30 tablet    Refill:  3    Order Specific Question:   Supervising Provider    Answer:   Lyndon CodeKHAN, FOZIA M [[id number]]    Time spent:15 Minutes    Dr Lyndon CodeFozia M Khan Internal medicine

## 2019-09-01 ENCOUNTER — Telehealth: Payer: Self-pay

## 2019-09-02 ENCOUNTER — Other Ambulatory Visit: Payer: Self-pay | Admitting: Nurse Practitioner

## 2019-09-02 DIAGNOSIS — N529 Male erectile dysfunction, unspecified: Secondary | ICD-10-CM

## 2019-09-02 MED ORDER — SILDENAFIL CITRATE 20 MG PO TABS: 20.0000 mg | ORAL_TABLET | Freq: Every day | ORAL | 3 refills | Status: DC | PRN

## 2019-09-02 NOTE — Progress Notes (Signed)
Refilled sildenifil 20mg  and sent to [facility]

## 2019-09-02 NOTE — Telephone Encounter (Signed)
Refilled sildenifil 20mg and sent to [facility]  

## 2019-09-21 ENCOUNTER — Ambulatory Visit: Payer: BC Managed Care – PPO | Admitting: Nurse Practitioner

## 2019-10-06 ENCOUNTER — Telehealth: Payer: Self-pay

## 2019-10-06 NOTE — Telephone Encounter (Signed)
Called lmom informing patient of virtual visit. klh 

## 2019-10-08 ENCOUNTER — Ambulatory Visit: Payer: Self-pay | Admitting: Nurse Practitioner

## 2019-12-15 ENCOUNTER — Other Ambulatory Visit: Payer: Self-pay

## 2019-12-15 DIAGNOSIS — I10 Essential (primary) hypertension: Secondary | ICD-10-CM

## 2019-12-15 MED ORDER — VERAPAMIL HCL ER 180 MG PO TBCR: 180.0000 mg | EXTENDED_RELEASE_TABLET | Freq: Every day | ORAL | 1 refills | Status: DC

## 2020-01-10 ENCOUNTER — Other Ambulatory Visit: Payer: Self-pay

## 2020-01-10 DIAGNOSIS — I10 Essential (primary) hypertension: Secondary | ICD-10-CM

## 2020-01-10 MED ORDER — VERAPAMIL HCL ER 180 MG PO TBCR: 180.0000 mg | EXTENDED_RELEASE_TABLET | Freq: Every day | ORAL | 0 refills | Status: DC

## 2020-01-27 ENCOUNTER — Other Ambulatory Visit: Payer: Self-pay

## 2020-01-27 DIAGNOSIS — N529 Male erectile dysfunction, unspecified: Secondary | ICD-10-CM

## 2020-01-27 MED ORDER — SILDENAFIL CITRATE 20 MG PO TABS: 20.0000 mg | ORAL_TABLET | Freq: Every day | ORAL | 3 refills | Status: DC | PRN

## 2020-02-09 ENCOUNTER — Telehealth: Payer: Self-pay

## 2020-02-09 NOTE — Telephone Encounter (Signed)
Confirmed and screened for 02-11-20 ov. 

## 2020-02-11 ENCOUNTER — Ambulatory Visit: Payer: Self-pay | Admitting: Nurse Practitioner

## 2020-02-11 ENCOUNTER — Encounter: Payer: Self-pay | Admitting: Nurse Practitioner

## 2020-02-11 ENCOUNTER — Other Ambulatory Visit: Payer: Self-pay | Admitting: Nurse Practitioner

## 2020-02-11 ENCOUNTER — Other Ambulatory Visit: Payer: Self-pay

## 2020-02-11 VITALS — BP 145/98 | HR 80 | Temp 97.3°F | Resp 16 | Ht 73.0 in | Wt 322.2 lb

## 2020-02-11 DIAGNOSIS — Z1211 Encounter for screening for malignant neoplasm of colon: Secondary | ICD-10-CM

## 2020-02-11 DIAGNOSIS — E291 Testicular hypofunction: Secondary | ICD-10-CM

## 2020-02-11 DIAGNOSIS — I10 Essential (primary) hypertension: Secondary | ICD-10-CM

## 2020-02-11 MED ORDER — TESTOSTERONE 20.25 MG/ACT (1.62%) TD GEL: TRANSDERMAL | 2 refills | Status: DC

## 2020-02-11 MED ORDER — HYDROCHLOROTHIAZIDE 25 MG PO TABS: 25.0000 mg | ORAL_TABLET | Freq: Every day | ORAL | 3 refills | Status: DC

## 2020-02-11 NOTE — Progress Notes (Signed)
[facility], [place] [address]  Internal MEDICINE  Office Visit Note  Patient Name: Daniel Cummings  [id number]  [id number]  Date of Service: 02/16/2020  Chief Complaint  Patient presents with  . Hypertension  . Obesity    would like to consider weight management possibilities   . Quality Metric Gaps    colonoscopy     The patient presents for routine follow up for hypertension and testicular hypofunction. His blood pressure is mildly elevated today. He is currently taking HCTZ 12.5mg  tablets daily. He denies chest pain, chest pressure, shortness of breath or headache. He also needs to have refill for his testosterone topical therapy. He is due to have check of labs, including testosterone levels.       Current Medication: Outpatient Encounter Medications as of 02/11/2020  Medication Sig  . hydrochlorothiazide (HYDRODIURIL) 25 MG tablet Take 1 tablet (25 mg total) by mouth daily.  Marland Kitchen lidocaine (XYLOCAINE) 2 % solution Apply very small amount to affected area four times daily as needed for pain  . sildenafil (REVATIO) 20 MG tablet Take 1 tablet (20 mg total) by mouth daily as needed.  . Testosterone (ANDROGEL PUMP) 20.25 MG/ACT (1.62%) GEL Use two pumps under each arm daily.  . verapamil (CALAN-SR) 180 MG CR tablet Take 1 tablet (180 mg total) by mouth daily.  . [DISCONTINUED] hydrochlorothiazide (HYDRODIURIL) 12.5 MG tablet Take 1 tablet (12.5 mg total) by mouth daily.  . [DISCONTINUED] Testosterone (ANDROGEL PUMP) 20.25 MG/ACT (1.62%) GEL Use two pumps under each arm daily.  . [DISCONTINUED] sulfamethoxazole-trimethoprim (BACTRIM DS) 800-160 MG tablet Take 1 tablet by mouth 2 (two) times daily. (Patient not taking: Reported on 02/11/2020)   No facility-administered encounter medications on file as of 02/11/2020.    Surgical History: Past Surgical History:  Procedure Laterality Date  . INGUINAL HERNIA REPAIR Left 1995    Medical History: Past  Medical History:  Diagnosis Date  . Hypertension     Family History: Family History  Problem Relation Age of Onset  . Coronary artery disease Neg Hx   . Diabetes Neg Hx   . Hypertension Neg Hx     Social History   Socioeconomic History  . Marital status: Married    Spouse name: Not on file  . Number of children: Not on file  . Years of education: Not on file  . Highest education level: Not on file  Occupational History  . Not on file  Tobacco Use  . Smoking status: Never Smoker  . Smokeless tobacco: Never Used  Substance and Sexual Activity  . Alcohol use: No  . Drug use: No  . Sexual activity: Not on file  Other Topics Concern  . Not on file  Social History Narrative  . Not on file   Social Determinants of Health   Financial Resource Strain:   . Difficulty of Paying Living Expenses:   Food Insecurity:   . Worried About Charity fundraiser in the Last Year:   . Arboriculturist in the Last Year:   Transportation Needs:   . Film/video editor (Medical):   Marland Kitchen Lack of Transportation (Non-Medical):   Physical Activity:   . Days of Exercise per Week:   . Minutes of Exercise per Session:   Stress:   . Feeling of Stress :   Social Connections:   . Frequency of Communication with Friends and Family:   . Frequency of Social Gatherings with Friends and Family:   .  Attends Religious Services:   . Active Member of Clubs or Organizations:   . Attends Banker Meetings:   Marland Kitchen Marital Status:   Intimate Partner Violence:   . Fear of Current or Ex-Partner:   . Emotionally Abused:   Marland Kitchen Physically Abused:   . Sexually Abused:       Review of Systems  Constitutional: Negative for activity change, chills, fatigue and unexpected weight change.  HENT: Negative for congestion, postnasal drip, rhinorrhea, sneezing and sore throat.   Respiratory: Negative for cough, chest tightness, shortness of breath and wheezing.   Cardiovascular: Negative for chest pain and  palpitations.       Elevated blood pressure today.  Gastrointestinal: Negative for abdominal pain, constipation, diarrhea, nausea and vomiting.  Endocrine: Negative for cold intolerance, heat intolerance, polydipsia and polyuria.  Genitourinary: Negative for dysuria and frequency.  Musculoskeletal: Negative for arthralgias, back pain, joint swelling and neck pain.  Skin: Negative for rash.  Allergic/Immunologic: Negative for environmental allergies.  Neurological: Negative for dizziness, tremors, numbness and headaches.  Hematological: Negative for adenopathy. Does not bruise/bleed easily.  Psychiatric/Behavioral: Negative for behavioral problems (Depression), sleep disturbance and suicidal ideas. The patient is not nervous/anxious.     Today's Vitals   02/11/20 1012  BP: (!) 145/98  Pulse: 80  Resp: 16  Temp: (!) 97.3 F (36.3 C)  SpO2: 98%  Weight: (!) 322 lb 3.2 oz (146.1 kg)  Height: 6\' 1"  (1.854 m)   Body mass index is 42.51 kg/m.  Physical Exam Vitals and nursing note reviewed.  Constitutional:      General: He is not in acute distress.    Appearance: Normal appearance. He is well-developed. He is not diaphoretic.  HENT:     Head: Normocephalic and atraumatic.     Nose: Nose normal.     Mouth/Throat:     Pharynx: No oropharyngeal exudate.  Eyes:     Pupils: Pupils are equal, round, and reactive to light.  Neck:     Thyroid: No thyromegaly.     Vascular: No carotid bruit or JVD.     Trachea: No tracheal deviation.  Cardiovascular:     Rate and Rhythm: Normal rate and regular rhythm.     Heart sounds: Normal heart sounds. No murmur. No friction rub. No gallop.   Pulmonary:     Effort: Pulmonary effort is normal. No respiratory distress.     Breath sounds: Normal breath sounds. No wheezing or rales.  Chest:     Chest wall: No tenderness.  Abdominal:     Palpations: Abdomen is soft.  Musculoskeletal:        General: Normal range of motion.     Cervical back:  Normal range of motion and neck supple.  Lymphadenopathy:     Cervical: No cervical adenopathy.  Skin:    General: Skin is warm and dry.  Neurological:     Mental Status: He is alert and oriented to person, place, and time.     Cranial Nerves: No cranial nerve deficit.  Psychiatric:        Mood and Affect: Mood normal.        Behavior: Behavior normal.        Thought Content: Thought content normal.        Judgment: Judgment normal.    Assessment/Plan: 1. Essential hypertension Increase HCTZ to 25mg  daily. Limit salt and increase water intake in the diet. Monitor closely.  - hydrochlorothiazide (HYDRODIURIL) 25 MG tablet; Take 1 tablet (  25 mg total) by mouth daily.  Dispense: 30 tablet; Refill: 3  2. Testicular hypofunction Check routine, fasting labs, including testosterone level. Adjust dosing of androgel as indicated.  - Testosterone (ANDROGEL PUMP) 20.25 MG/ACT (1.62%) GEL; Use two pumps under each arm daily.  Dispense: 75 g; Refill: 2  3. Screening for colon cancer Order for cologuard testing today.   General Counseling: Daniel Cummings understanding of the findings of todays visit and agrees with plan of treatment. I have discussed any further diagnostic evaluation that may be needed or ordered today. We also reviewed his medications today. he has been encouraged to call the office with any questions or concerns that should arise related to todays visit.   Hypertension Counseling:   The following hypertensive lifestyle modification were recommended and discussed:  1. Limiting alcohol intake to less than 1 oz/day of ethanol:(24 oz of beer or 8 oz of wine or 2 oz of 100-proof whiskey). 2. Take baby ASA 81 mg daily. 3. Importance of regular aerobic exercise and losing weight. 4. Reduce dietary saturated fat and cholesterol intake for overall cardiovascular health. 5. Maintaining adequate dietary potassium, calcium, and magnesium intake. 6. Regular monitoring of the blood  pressure. 7. Reduce sodium intake to less than 100 mmol/day (less than 2.3 gm of sodium or less than 6 gm of sodium choride)   This patient was seen by Vincent Gros FNP Collaboration with Dr Lyndon Code as a part of collaborative care agreement  Meds ordered this encounter  Medications  . hydrochlorothiazide (HYDRODIURIL) 25 MG tablet    Sig: Take 1 tablet (25 mg total) by mouth daily.    Dispense:  30 tablet    Refill:  3    Please note increased dosing    Order Specific Question:   Supervising Provider    Answer:   Lyndon Code [[id number]]  . Testosterone (ANDROGEL PUMP) 20.25 MG/ACT (1.62%) GEL    Sig: Use two pumps under each arm daily.    Dispense:  75 g    Refill:  2    Please change to alternative preferred per his insurance and less expensive. Thank.s    Order Specific Question:   Supervising Provider    Answer:   Lyndon Code [[id number]]    Total time spent: 30 Minutes   Time spent includes review of chart, medications, test results, and follow up plan with the patient.      Dr Lyndon Code Internal medicine

## 2020-02-12 LAB — COMPREHENSIVE METABOLIC PANEL
ALT: 30 IU/L (ref 0–44)
AST: 30 IU/L (ref 0–40)
Albumin/Globulin Ratio: 1.2 (ref 1.2–2.2)
Albumin: 4.1 g/dL (ref 4.0–5.0)
Alkaline Phosphatase: 105 IU/L (ref 48–121)
BUN/Creatinine Ratio: 15 (ref 9–20)
BUN: 16 mg/dL (ref 6–24)
Bilirubin Total: 0.6 mg/dL (ref 0.0–1.2)
CO2: 23 mmol/L (ref 20–29)
Calcium: 9.5 mg/dL (ref 8.7–10.2)
Chloride: 102 mmol/L (ref 96–106)
Creatinine, Ser: 1.04 mg/dL (ref 0.76–1.27)
GFR calc Af Amer: 96 mL/min/{1.73_m2} (ref >59)
GFR calc non Af Amer: 83 mL/min/{1.73_m2} (ref >59)
Globulin, Total: 3.3 g/dL (ref 1.5–4.5)
Glucose: 93 mg/dL (ref 65–99)
Potassium: 4.4 mmol/L (ref 3.5–5.2)
Sodium: 139 mmol/L (ref 134–144)
Total Protein: 7.4 g/dL (ref 6.0–8.5)

## 2020-02-12 LAB — TSH: TSH: 1.53 u[IU]/mL (ref 0.450–4.500)

## 2020-02-12 LAB — TESTOSTERONE: Testosterone: 301 ng/dL (ref 264–916)

## 2020-02-12 LAB — LIPID PANEL WITH LDL/HDL RATIO
Cholesterol, Total: 148 mg/dL (ref 100–199)
HDL: 45 mg/dL (ref >39)
LDL Chol Calc (NIH): 88 mg/dL (ref 0–99)
LDL/HDL Ratio: 2 ratio (ref 0.0–3.6)
Triglycerides: 74 mg/dL (ref 0–149)
VLDL Cholesterol Cal: 15 mg/dL (ref 5–40)

## 2020-02-12 LAB — CBC
Hematocrit: 42.4 % (ref 37.5–51.0)
Hemoglobin: 14 g/dL (ref 13.0–17.7)
MCH: 27.9 pg (ref 26.6–33.0)
MCHC: 33 g/dL (ref 31.5–35.7)
MCV: 85 fL (ref 79–97)
Platelets: 293 10*3/uL (ref 150–450)
RBC: 5.01 x10E6/uL (ref 4.14–5.80)
RDW: 13.7 % (ref 11.6–15.4)
WBC: 8.3 10*3/uL (ref 3.4–10.8)

## 2020-02-12 LAB — T4, FREE: Free T4: 1.09 ng/dL (ref 0.82–1.77)

## 2020-02-12 LAB — PSA: Prostate Specific Ag, Serum: 0.5 ng/mL (ref 0.0–4.0)

## 2020-02-16 DIAGNOSIS — Z1211 Encounter for screening for malignant neoplasm of colon: Secondary | ICD-10-CM | POA: Insufficient documentation

## 2020-02-16 NOTE — Progress Notes (Signed)
All albs normal. No changes with medication. Discuss at visit 04/07/2020

## 2020-02-28 ENCOUNTER — Other Ambulatory Visit: Payer: Self-pay

## 2020-02-28 DIAGNOSIS — I10 Essential (primary) hypertension: Secondary | ICD-10-CM

## 2020-02-28 MED ORDER — VERAPAMIL HCL ER 180 MG PO TBCR: 180.0000 mg | EXTENDED_RELEASE_TABLET | Freq: Every day | ORAL | 0 refills | Status: DC

## 2020-04-05 ENCOUNTER — Telehealth: Payer: Self-pay

## 2020-04-05 NOTE — Telephone Encounter (Signed)
Confirmed and screened for 04-07-20 ov. °

## 2020-04-07 ENCOUNTER — Ambulatory Visit: Payer: Self-pay | Admitting: Nurse Practitioner

## 2020-04-07 ENCOUNTER — Other Ambulatory Visit: Payer: Self-pay | Admitting: Nurse Practitioner

## 2020-04-07 MED ORDER — PHENTERMINE HCL 37.5 MG PO TABS: 37.5000 mg | ORAL_TABLET | Freq: Every day | ORAL | 0 refills | Status: DC

## 2020-05-22 ENCOUNTER — Other Ambulatory Visit: Payer: Self-pay

## 2020-05-22 DIAGNOSIS — I10 Essential (primary) hypertension: Secondary | ICD-10-CM

## 2020-05-22 MED ORDER — VERAPAMIL HCL ER 180 MG PO TBCR: 180.0000 mg | EXTENDED_RELEASE_TABLET | Freq: Every day | ORAL | 0 refills | Status: DC

## 2020-05-23 ENCOUNTER — Other Ambulatory Visit: Payer: Self-pay

## 2020-05-23 DIAGNOSIS — I10 Essential (primary) hypertension: Secondary | ICD-10-CM

## 2020-05-23 MED ORDER — VERAPAMIL HCL ER 180 MG PO TBCR: 180.0000 mg | EXTENDED_RELEASE_TABLET | Freq: Every day | ORAL | 3 refills | Status: DC

## 2020-05-31 ENCOUNTER — Other Ambulatory Visit: Payer: Self-pay

## 2020-05-31 DIAGNOSIS — N529 Male erectile dysfunction, unspecified: Secondary | ICD-10-CM

## 2020-05-31 MED ORDER — SILDENAFIL CITRATE 20 MG PO TABS: 20.0000 mg | ORAL_TABLET | Freq: Every day | ORAL | 3 refills | Status: DC | PRN

## 2020-06-29 ENCOUNTER — Other Ambulatory Visit: Payer: Self-pay

## 2020-09-18 ENCOUNTER — Other Ambulatory Visit: Payer: Self-pay

## 2020-09-18 DIAGNOSIS — I10 Essential (primary) hypertension: Secondary | ICD-10-CM

## 2020-09-18 MED ORDER — HYDROCHLOROTHIAZIDE 25 MG PO TABS: 25.0000 mg | ORAL_TABLET | Freq: Every day | ORAL | 1 refills | Status: DC

## 2020-10-18 ENCOUNTER — Telehealth: Payer: Self-pay

## 2020-10-18 NOTE — Telephone Encounter (Signed)
LMOM for pt to make appt for refill request

## 2020-10-20 ENCOUNTER — Other Ambulatory Visit: Payer: Self-pay

## 2020-10-20 DIAGNOSIS — I10 Essential (primary) hypertension: Secondary | ICD-10-CM

## 2020-10-20 MED ORDER — VERAPAMIL HCL ER 180 MG PO TBCR: 180.0000 mg | EXTENDED_RELEASE_TABLET | Freq: Every day | ORAL | 3 refills | Status: DC

## 2020-11-24 ENCOUNTER — Encounter: Payer: Self-pay | Admitting: Physician Assistant

## 2020-11-24 ENCOUNTER — Other Ambulatory Visit: Payer: Self-pay

## 2020-11-24 ENCOUNTER — Ambulatory Visit (INDEPENDENT_AMBULATORY_CARE_PROVIDER_SITE_OTHER): Payer: BC Managed Care – PPO | Admitting: Physician Assistant

## 2020-11-24 DIAGNOSIS — E291 Testicular hypofunction: Secondary | ICD-10-CM | POA: Diagnosis not present

## 2020-11-24 DIAGNOSIS — Z1211 Encounter for screening for malignant neoplasm of colon: Secondary | ICD-10-CM | POA: Diagnosis not present

## 2020-11-24 DIAGNOSIS — I10 Essential (primary) hypertension: Secondary | ICD-10-CM

## 2020-11-24 DIAGNOSIS — N529 Male erectile dysfunction, unspecified: Secondary | ICD-10-CM | POA: Diagnosis not present

## 2020-11-24 MED ORDER — VERAPAMIL HCL ER 180 MG PO TBCR: 180.0000 mg | EXTENDED_RELEASE_TABLET | Freq: Every day | ORAL | 1 refills | Status: DC

## 2020-11-24 MED ORDER — SILDENAFIL CITRATE 20 MG PO TABS: 20.0000 mg | ORAL_TABLET | Freq: Every day | ORAL | 0 refills | Status: DC | PRN

## 2020-11-24 MED ORDER — HYDROCHLOROTHIAZIDE 25 MG PO TABS: 25.0000 mg | ORAL_TABLET | Freq: Every day | ORAL | 1 refills | Status: DC

## 2020-11-24 MED ORDER — TESTOSTERONE 20.25 MG/ACT (1.62%) TD GEL: TRANSDERMAL | 0 refills | Status: DC

## 2020-11-24 MED ORDER — BUPROPION HCL ER (XL) 150 MG PO TB24: 150.0000 mg | ORAL_TABLET | Freq: Every day | ORAL | 0 refills | Status: DC

## 2020-11-24 NOTE — Progress Notes (Signed)
[facility] [address] [place], [place] [address]  Internal MEDICINE  Office Visit Note  Patient Name: Daniel Cummings  [id number]  [id number]  Date of Service: 11/24/2020  Chief Complaint  Patient presents with  . Hypertension  . Quality Metric Gaps    Cologuard      HPI Pt is here for f/u.  -Pt is trying to work on weight loss. He had previously been on phentermine but it has been a long time since he has taken it. Discussed alternative weight loss medications. -He is now taking a potassium supplement, because he does not like bananas and was worried it was low. Educated to not take too much of this.  -He has not been on testosterone gel in awhile bc of lack of insurance and would like to get back on this. His last testosterone was low normal when he was taking the gel. I explained I could refill once, but that he needs to be seen by urology for further evaluation and refills for both HRT and sildenafil. -His BP has been better. He is taking HCTZ and verapamil and needs refills.   Current Medication: Outpatient Encounter Medications as of 11/24/2020  Medication Sig  . buPROPion (WELLBUTRIN XL) 150 MG 24 hr tablet Take 1 tablet (150 mg total) by mouth daily.  . [DISCONTINUED] hydrochlorothiazide (HYDRODIURIL) 25 MG tablet Take 1 tablet (25 mg total) by mouth daily.  . [DISCONTINUED] lidocaine (XYLOCAINE) 2 % solution Apply very small amount to affected area four times daily as needed for pain  . [DISCONTINUED] phentermine (ADIPEX-P) 37.5 MG tablet Take 1 tablet (37.5 mg total) by mouth daily before breakfast.  . [DISCONTINUED] sildenafil (REVATIO) 20 MG tablet Take 1 tablet (20 mg total) by mouth daily as needed.  . [DISCONTINUED] Testosterone (ANDROGEL PUMP) 20.25 MG/ACT (1.62%) GEL Use two pumps under each arm daily.  . [DISCONTINUED] verapamil (CALAN-SR) 180 MG CR tablet Take 1 tablet (180 mg total) by mouth daily.  . hydrochlorothiazide (HYDRODIURIL) 25 MG tablet  Take 1 tablet (25 mg total) by mouth daily.  . sildenafil (REVATIO) 20 MG tablet Take 1 tablet (20 mg total) by mouth daily as needed.  . Testosterone (ANDROGEL PUMP) 20.25 MG/ACT (1.62%) GEL Use two pumps under each arm daily.  . verapamil (CALAN-SR) 180 MG CR tablet Take 1 tablet (180 mg total) by mouth daily.   No facility-administered encounter medications on file as of 11/24/2020.    Surgical History: Past Surgical History:  Procedure Laterality Date  . INGUINAL HERNIA REPAIR Left 1995    Medical History: Past Medical History:  Diagnosis Date  . Hypertension     Family History: Family History  Problem Relation Age of Onset  . Coronary artery disease Neg Hx   . Diabetes Neg Hx   . Hypertension Neg Hx     Social History   Socioeconomic History  . Marital status: Married    Spouse name: Not on file  . Number of children: Not on file  . Years of education: Not on file  . Highest education level: Not on file  Occupational History  . Not on file  Tobacco Use  . Smoking status: Never Smoker  . Smokeless tobacco: Never Used  Vaping Use  . Vaping Use: Never used  Substance and Sexual Activity  . Alcohol use: No  . Drug use: No  . Sexual activity: Not on file  Other Topics Concern  . Not on file  Social History Narrative  .  Not on file   Social Determinants of Health   Financial Resource Strain: Not on file  Food Insecurity: Not on file  Transportation Needs: Not on file  Physical Activity: Not on file  Stress: Not on file  Social Connections: Not on file  Intimate Partner Violence: Not on file      Review of Systems  Constitutional: Positive for unexpected weight change. Negative for chills and fatigue.  HENT: Negative for congestion, postnasal drip, rhinorrhea, sneezing and sore throat.   Eyes: Negative for redness.  Respiratory: Negative for cough, chest tightness and shortness of breath.   Cardiovascular: Negative for chest pain and palpitations.   Gastrointestinal: Negative for abdominal pain, constipation, diarrhea, nausea and vomiting.  Genitourinary: Negative for dysuria and frequency.       ED  Musculoskeletal: Negative for arthralgias, back pain, joint swelling and neck pain.  Skin: Negative for rash.  Neurological: Negative.  Negative for tremors and numbness.  Hematological: Negative for adenopathy. Does not bruise/bleed easily.  Psychiatric/Behavioral: Negative for behavioral problems (Depression), sleep disturbance and suicidal ideas. The patient is not nervous/anxious.     Vital Signs: BP 137/86   Pulse 91   Temp 97.8 F (36.6 C)   Resp 16   Ht 6\' 1"  (1.854 m)   Wt (!) 325 lb (147.4 kg)   SpO2 96%   BMI 42.88 kg/m    Physical Exam Constitutional:      General: He is not in acute distress.    Appearance: He is well-developed. He is obese. He is not diaphoretic.  HENT:     Head: Normocephalic and atraumatic.     Mouth/Throat:     Pharynx: No oropharyngeal exudate.  Eyes:     Pupils: Pupils are equal, round, and reactive to light.  Neck:     Thyroid: No thyromegaly.     Vascular: No JVD.     Trachea: No tracheal deviation.  Cardiovascular:     Rate and Rhythm: Normal rate and regular rhythm.     Heart sounds: Normal heart sounds. No murmur heard. No friction rub. No gallop.   Pulmonary:     Effort: Pulmonary effort is normal. No respiratory distress.     Breath sounds: No wheezing or rales.  Chest:     Chest wall: No tenderness.  Abdominal:     General: Bowel sounds are normal.     Palpations: Abdomen is soft.  Musculoskeletal:        General: Normal range of motion.     Cervical back: Normal range of motion and neck supple.  Lymphadenopathy:     Cervical: No cervical adenopathy.  Skin:    General: Skin is warm and dry.  Neurological:     Mental Status: He is alert and oriented to person, place, and time.     Cranial Nerves: No cranial nerve deficit.  Psychiatric:        Behavior: Behavior  normal.        Thought Content: Thought content normal.        Judgment: Judgment normal.        Assessment/Plan: 1. Essential hypertension BP stable. Continue current medications. May need to consider changing verapamil as a potential cause of ED.  - verapamil (CALAN-SR) 180 MG CR tablet; Take 1 tablet (180 mg total) by mouth daily.  Dispense: 90 tablet; Refill: 1 - hydrochlorothiazide (HYDRODIURIL) 25 MG tablet; Take 1 tablet (25 mg total) by mouth daily.  Dispense: 90 tablet; Refill: 1  2.  Testicular hypofunction Pt will be referred to ED for further management for HRT and sildenafil. Pt understood we will refill this one time and then needs to be followed by urology for this. - Ambulatory referral to Urology - Testosterone (ANDROGEL PUMP) 20.25 MG/ACT (1.62%) GEL; Use two pumps under each arm daily.  Dispense: 75 g; Refill: 0  3. Erectile dysfunction, unspecified erectile dysfunction type - Ambulatory referral to Urology - sildenafil (REVATIO) 20 MG tablet; Take 1 tablet (20 mg total) by mouth daily as needed.  Dispense: 15 tablet; Refill: 0  4. Screening for colon cancer Will be set up for cologuard.  5. Morbid obesity (HCC) Will start on wellbutrin daily in AM to help with wt loss. Pt understands that he must continue to work on diet and exercise and will need to be seen in 1 month for wt loss f/u. - buPROPion (WELLBUTRIN XL) 150 MG 24 hr tablet; Take 1 tablet (150 mg total) by mouth daily.  Dispense: 30 tablet; Refill: 0   General Counseling: rayhan groleau understanding of the findings of todays visit and agrees with plan of treatment. I have discussed any further diagnostic evaluation that may be needed or ordered today. We also reviewed his medications today. he has been encouraged to call the office with any questions or concerns that should arise related to todays visit.    Orders Placed This Encounter  Procedures  . Ambulatory referral to Urology    Meds  ordered this encounter  Medications  . buPROPion (WELLBUTRIN XL) 150 MG 24 hr tablet    Sig: Take 1 tablet (150 mg total) by mouth daily.    Dispense:  30 tablet    Refill:  0  . verapamil (CALAN-SR) 180 MG CR tablet    Sig: Take 1 tablet (180 mg total) by mouth daily.    Dispense:  90 tablet    Refill:  1  . hydrochlorothiazide (HYDRODIURIL) 25 MG tablet    Sig: Take 1 tablet (25 mg total) by mouth daily.    Dispense:  90 tablet    Refill:  1  . sildenafil (REVATIO) 20 MG tablet    Sig: Take 1 tablet (20 mg total) by mouth daily as needed.    Dispense:  15 tablet    Refill:  0  . Testosterone (ANDROGEL PUMP) 20.25 MG/ACT (1.62%) GEL    Sig: Use two pumps under each arm daily.    Dispense:  75 g    Refill:  0    Please change to alternative preferred per his insurance and less expensive. Thank.s    This patient was seen by Lynn Ito, PA-C in collaboration with Dr. Beverely Risen as a part of collaborative care agreement.   Total time spent:30 Minutes Time spent includes review of chart, medications, test results, and follow up plan with the patient.      Dr Lyndon Code Internal medicine

## 2020-12-02 ENCOUNTER — Other Ambulatory Visit: Payer: Self-pay

## 2020-12-02 ENCOUNTER — Other Ambulatory Visit: Payer: Self-pay | Admitting: Physician Assistant

## 2020-12-02 DIAGNOSIS — N529 Male erectile dysfunction, unspecified: Secondary | ICD-10-CM

## 2020-12-02 MED ORDER — SILDENAFIL CITRATE 20 MG PO TABS: 20.0000 mg | ORAL_TABLET | Freq: Every day | ORAL | 0 refills | Status: DC | PRN

## 2020-12-02 NOTE — Telephone Encounter (Signed)
error sending in refills on sildenafil, called pharmacy and had them to discontinue the refill i sent in

## 2020-12-17 ENCOUNTER — Other Ambulatory Visit: Payer: Self-pay | Admitting: Physician Assistant

## 2020-12-28 ENCOUNTER — Ambulatory Visit: Payer: BC Managed Care – PPO | Admitting: Physician Assistant

## 2020-12-28 ENCOUNTER — Other Ambulatory Visit: Payer: Self-pay

## 2021-02-20 ENCOUNTER — Other Ambulatory Visit: Payer: Self-pay

## 2021-02-20 ENCOUNTER — Telehealth: Payer: Self-pay

## 2021-02-20 NOTE — Telephone Encounter (Signed)
Spoke with Daniel Cummings about expired cologuard, patient takes he needs a new order, order for cologuard  faxed over on 02/20/2021. LNB

## 2021-03-26 ENCOUNTER — Other Ambulatory Visit: Payer: Self-pay | Admitting: Physician Assistant

## 2021-05-14 ENCOUNTER — Other Ambulatory Visit: Payer: Self-pay | Admitting: Physician Assistant

## 2021-05-14 DIAGNOSIS — I10 Essential (primary) hypertension: Secondary | ICD-10-CM

## 2021-05-18 ENCOUNTER — Other Ambulatory Visit: Payer: Self-pay | Admitting: Physician Assistant

## 2021-05-18 ENCOUNTER — Telehealth: Payer: Self-pay

## 2021-05-18 DIAGNOSIS — I10 Essential (primary) hypertension: Secondary | ICD-10-CM

## 2021-05-18 NOTE — Telephone Encounter (Signed)
Left vm for patient to return my call to schedule cpe-Toni 

## 2021-05-18 NOTE — Telephone Encounter (Signed)
Pt need appt for refills  ?

## 2021-05-21 NOTE — Telephone Encounter (Signed)
Left another vm and sent message to patient to schedule cpe.

## 2021-05-23 ENCOUNTER — Telehealth: Payer: Self-pay

## 2021-05-23 NOTE — Telephone Encounter (Signed)
LMOM that his cologuard sample needs to be returned as soon as possible and informed him that the orders do eventually expire

## 2021-05-28 ENCOUNTER — Other Ambulatory Visit: Payer: Self-pay | Admitting: Physician Assistant

## 2021-05-28 DIAGNOSIS — I10 Essential (primary) hypertension: Secondary | ICD-10-CM

## 2021-11-16 ENCOUNTER — Other Ambulatory Visit: Payer: Self-pay

## 2021-11-16 ENCOUNTER — Ambulatory Visit (INDEPENDENT_AMBULATORY_CARE_PROVIDER_SITE_OTHER): Payer: BC Managed Care – PPO | Admitting: Physician Assistant

## 2021-11-16 ENCOUNTER — Encounter: Payer: Self-pay | Admitting: Physician Assistant

## 2021-11-16 DIAGNOSIS — E291 Testicular hypofunction: Secondary | ICD-10-CM

## 2021-11-16 DIAGNOSIS — Z6841 Body Mass Index (BMI) 40.0 and over, adult: Secondary | ICD-10-CM

## 2021-11-16 DIAGNOSIS — N529 Male erectile dysfunction, unspecified: Secondary | ICD-10-CM | POA: Diagnosis not present

## 2021-11-16 DIAGNOSIS — R5383 Other fatigue: Secondary | ICD-10-CM

## 2021-11-16 DIAGNOSIS — I10 Essential (primary) hypertension: Secondary | ICD-10-CM

## 2021-11-16 DIAGNOSIS — L739 Follicular disorder, unspecified: Secondary | ICD-10-CM | POA: Diagnosis not present

## 2021-11-16 MED ORDER — HYDROCHLOROTHIAZIDE 25 MG PO TABS: 25.0000 mg | ORAL_TABLET | Freq: Every day | ORAL | 0 refills | Status: DC

## 2021-11-16 MED ORDER — SILDENAFIL CITRATE 20 MG PO TABS: 20.0000 mg | ORAL_TABLET | Freq: Every day | ORAL | 0 refills | Status: DC | PRN

## 2021-11-16 MED ORDER — VERAPAMIL HCL ER 180 MG PO TBCR: 180.0000 mg | EXTENDED_RELEASE_TABLET | Freq: Every day | ORAL | 1 refills | Status: DC

## 2021-11-16 MED ORDER — SEMAGLUTIDE-WEIGHT MANAGEMENT 0.25 MG/0.5ML ~~LOC~~ SOAJ: 0.2500 mg | SUBCUTANEOUS | 0 refills | Status: DC

## 2021-11-16 MED ORDER — DOXYCYCLINE HYCLATE 100 MG PO TABS: 100.0000 mg | ORAL_TABLET | Freq: Two times a day (BID) | ORAL | 0 refills | Status: DC

## 2021-11-16 NOTE — Progress Notes (Signed)
Pt called back after office visit and advised that his insurance is requiring a PA for the [facility] and pt was given a sample to use until we get PA done  ?

## 2021-11-16 NOTE — Progress Notes (Unsigned)
[facility] [address] [place], [place] [address]  Internal MEDICINE  Office Visit Note  Patient Name: Daniel Cummings  [id number]  [id number]  Date of Service: 11/16/2021  Chief Complaint  Patient presents with   Follow-up   Hypertension   Weight Loss    Concerned about weight loss, requesting meds   Medication Problem    Pt is asking for a stronger version Sildenafil    HPI Pt is here for routine follow up -Tried wellbutrin and made him more groggy, and didn't really help weight -152/90  Current Medication: Outpatient Encounter Medications as of 11/16/2021  Medication Sig   buPROPion (WELLBUTRIN XL) 150 MG 24 hr tablet TAKE 1 TABLET BY MOUTH EVERY DAY   hydrochlorothiazide (HYDRODIURIL) 25 MG tablet TAKE 1 TABLET (25 MG TOTAL) BY MOUTH DAILY.   sildenafil (REVATIO) 20 MG tablet Take 1 tablet (20 mg total) by mouth daily as needed.   Testosterone (ANDROGEL PUMP) 20.25 MG/ACT (1.62%) GEL Use two pumps under each arm daily.   verapamil (CALAN-SR) 180 MG CR tablet TAKE 1 TABLET BY MOUTH EVERY DAY   No facility-administered encounter medications on file as of 11/16/2021.    Surgical History: Past Surgical History:  Procedure Laterality Date   INGUINAL HERNIA REPAIR Left 1995    Medical History: Past Medical History:  Diagnosis Date   Hypertension     Family History: Family History  Problem Relation Age of Onset   Coronary artery disease Neg Hx    Diabetes Neg Hx    Hypertension Neg Hx     Social History   Socioeconomic History   Marital status: Married    Spouse name: Not on file   Number of children: Not on file   Years of education: Not on file   Highest education level: Not on file  Occupational History   Not on file  Tobacco Use   Smoking status: Never   Smokeless tobacco: Never  Vaping Use   Vaping Use: Never used  Substance and Sexual Activity   Alcohol use: No   Drug use: No   Sexual activity: Not on file  Other Topics  Concern   Not on file  Social History Narrative   Not on file   Social Determinants of Health   Financial Resource Strain: Not on file  Food Insecurity: Not on file  Transportation Needs: Not on file  Physical Activity: Not on file  Stress: Not on file  Social Connections: Not on file  Intimate Partner Violence: Not on file      Review of Systems  Vital Signs: BP (!) 154/99    Pulse 84    Temp 98 F (36.7 C)    Resp 16    Ht 6\' 1"  (1.854 m)    Wt (!) 341 lb (154.7 kg)    SpO2 95%    BMI 44.99 kg/m    Physical Exam     Assessment/Plan:   General Counseling: [name] verbalizes understanding of the findings of todays visit and agrees with plan of treatment. I have discussed any further diagnostic evaluation that may be needed or ordered today. We also reviewed his medications today. he has been encouraged to call the office with any questions or concerns that should arise related to todays visit.    No orders of the defined types were placed in this encounter.   No orders of the defined types were placed in this encounter.   This patient was seen by Sharlet Salina  Merikay Lesniewski, PA-C in collaboration with Dr. Beverely Risen as a part of collaborative care agreement.   Total time spent:*** Minutes Time spent includes review of chart, medications, test results, and follow up plan with the patient.      Dr Lyndon Code Internal medicine

## 2021-11-18 ENCOUNTER — Telehealth: Payer: Self-pay

## 2021-11-18 NOTE — Telephone Encounter (Signed)
PA for [facility] sent 11/18/21 @ 839 pm ?

## 2021-11-26 ENCOUNTER — Other Ambulatory Visit: Payer: Self-pay | Admitting: Physician Assistant

## 2021-11-26 DIAGNOSIS — N529 Male erectile dysfunction, unspecified: Secondary | ICD-10-CM

## 2021-11-29 ENCOUNTER — Encounter: Payer: Self-pay | Admitting: Urology

## 2021-11-29 ENCOUNTER — Ambulatory Visit: Payer: BC Managed Care – PPO | Admitting: Urology

## 2021-11-29 ENCOUNTER — Other Ambulatory Visit: Payer: Self-pay

## 2021-11-29 VITALS — BP 151/86 | HR 86 | Ht 73.0 in | Wt 330.0 lb

## 2021-11-29 DIAGNOSIS — N5201 Erectile dysfunction due to arterial insufficiency: Secondary | ICD-10-CM

## 2021-11-29 DIAGNOSIS — E291 Testicular hypofunction: Secondary | ICD-10-CM | POA: Diagnosis not present

## 2021-11-29 MED ORDER — SILDENAFIL CITRATE 100 MG PO TABS: ORAL_TABLET | ORAL | 3 refills | Status: DC

## 2021-11-29 NOTE — Addendum Note (Signed)
Addended by: Levada Schilling on: 11/29/2021 02:17 PM ? ? Modules accepted: Orders ? ?

## 2021-11-29 NOTE — Progress Notes (Signed)
? ?11/29/2021 ?1:48 PM  ? ?[name] ?Nov 02, 1970 ?[id number] ? ?Referring provider: Carlean Jews, PA-C ?[phone] ?[place],  [place] [address] ? ?No chief complaint on file. ? ? ?HPI: ?Daniel Cummings is a 51 y.o. male referred for erectile dysfunction ? ?5-year history of difficulty achieving an erection ?No pain or curvature with erections ?Organic risk factors hypertension and antihypertensive medication ?Was prescribed sildenafil 20 mg which he states is effective but feels his erections could be firmer ?No significant side effects ?No oral or sublingual nitrates ?History of hypogonadism on topical testosterone therapy which he stopped approximately 1 year ago due to expense. ?He is interesting in restarting TRT due to tiredness and fatigue ? ?PMH: ?Past Medical History:  ?Diagnosis Date  ? Hypertension   ? ? ?Surgical History: ?Past Surgical History:  ?Procedure Laterality Date  ? INGUINAL HERNIA REPAIR Left 1995  ? ? ?Home Medications:  ?Allergies as of 11/29/2021   ?No Known Allergies ?  ? ?  ?Medication List  ?  ? ?  ? Accurate as of November 29, 2021  1:48 PM. If you have any questions, ask your nurse or doctor.  ?  ?  ? ?  ? ?buPROPion 150 MG 24 hr tablet ?Commonly known as: WELLBUTRIN XL ?TAKE 1 TABLET BY MOUTH EVERY DAY ?  ?doxycycline 100 MG tablet ?Commonly known as: VIBRA-TABS ?Take 1 tablet (100 mg total) by mouth 2 (two) times daily. ?  ?hydrochlorothiazide 25 MG tablet ?Commonly known as: HYDRODIURIL ?Take 1 tablet (25 mg total) by mouth daily. ?  ?Semaglutide-Weight Management 0.25 MG/0.5ML Soaj ?Inject 0.25 mg into the skin once a week. ?  ?sildenafil 20 MG tablet ?Commonly known as: REVATIO ?Take 1 tablet (20 mg total) by mouth daily as needed. ?  ?Testosterone 20.25 MG/ACT (1.62%) Gel ?Commonly known as: AndroGel Pump ?Use two pumps under each arm daily. ?  ?verapamil 180 MG CR tablet ?Commonly known as: CALAN-SR ?Take 1 tablet (180 mg total) by mouth daily. ?  ? ?  ? ? ?Allergies: No  Known Allergies ? ?Family History: ?Family History  ?Problem Relation Age of Onset  ? Coronary artery disease Neg Hx   ? Diabetes Neg Hx   ? Hypertension Neg Hx   ? ? ?Social History:  reports that he has never smoked. He has never used smokeless tobacco. He reports that he does not drink alcohol and does not use drugs. ? ? ?Physical Exam: ?BP (!) 151/86   Pulse 86   Ht 6\' 1"  (1.854 m)   Wt (!) 330 lb (149.7 kg)   BMI 43.54 kg/m?   ?Constitutional:  Alert and oriented, No acute distress. ?HEENT: [place] AT, moist mucus membranes.  Trachea midline, no masses. ?Cardiovascular: No clubbing, cyanosis, or edema. ?Respiratory: Normal respiratory effort, no increased work of breathing. ?GI: Abdomen is soft, nontender, nondistended, no abdominal masses ?GU: Phallus without lesions or plaques.  Testes descended bilateral without masses or ?Psychiatric: Normal mood and affect. ? ? ?Assessment & Plan:   ? ?1.  Erectile dysfunction ?On a low-dose of sildenafil ?Rx sildenafil 100 mg; can start with one half tab and titrate to 1 tab if needed ? ?2.  Hypogonadism ?Interested in restarting TRT ?On record review I only see 1 low testosterone level and we discussed that insurance companies typically require 2 abnormally low levels drawn in the morning ?Will schedule an a.m. testosterone level and LH.  Last PSA 02/2020 and a PSA will also be ordered ?We  discussed the diagnosis of testosterone is based on 2 abnormal total testosterone levels drawn in the a.m. admitted with signs and symptoms of low testosterone. ?We discussed various forms of testosterone placement including topical preparations, intramuscular injections, subcutaneous injections, subcutaneous pellet implantation and oral testosterone.  Pros and cons of each form were discussed.  The risk of transference of topical testosterone. ?I had an extensive discussion regarding testosterone replacement therapy including the following: Treatment may result in improvements in  erectile function, low sex drive, anemia, bone mineral density, lean body mass, and depressive symptoms; evidence is inconclusive whether testosterone therapy improves cognitive function, measures of diabetes, energy, fatigue, lipid profiles, and quality of life measures; there is no conclusive evidence linking testosterone therapy to the development of prostate cancer; there is no definitive evidence linking testosterone therapy to a higher incidence of venothrombolic events; at the present time it cannot be stated definitively whether testosterone therapy increases or decreases the risk of cardiovascular events including myocardial infarction and stroke. ?Potential side effects were discussed including erythrocytosis, gynecomastia.  The need for regular monitoring of testosterone levels and hematocrit was discussed. ? ? ?Riki Altes, MD ? ?[facility] ?[address], [address] ?[place], [place] [address] ?[phone] ? ?

## 2021-11-30 ENCOUNTER — Telehealth: Payer: Self-pay

## 2021-11-30 MED ORDER — SEMAGLUTIDE-WEIGHT MANAGEMENT 0.25 MG/0.5ML ~~LOC~~ SOAJ: 0.2500 mg | SUBCUTANEOUS | 0 refills | Status: DC

## 2021-11-30 NOTE — Telephone Encounter (Signed)
PA for [facility] was approved 11/18/2021 and valid thru 06/20/22.  New rx sent to pharmacy ?

## 2021-12-04 ENCOUNTER — Other Ambulatory Visit: Payer: Self-pay | Admitting: Physician Assistant

## 2021-12-04 DIAGNOSIS — I10 Essential (primary) hypertension: Secondary | ICD-10-CM

## 2021-12-07 ENCOUNTER — Other Ambulatory Visit: Payer: BC Managed Care – PPO

## 2021-12-07 DIAGNOSIS — E291 Testicular hypofunction: Secondary | ICD-10-CM

## 2021-12-07 DIAGNOSIS — N5201 Erectile dysfunction due to arterial insufficiency: Secondary | ICD-10-CM

## 2021-12-08 LAB — LUTEINIZING HORMONE: LH: 3.8 m[IU]/mL (ref 1.7–8.6)

## 2021-12-08 LAB — TESTOSTERONE: Testosterone: 166 ng/dL — ABNORMAL LOW (ref 264–916)

## 2021-12-08 LAB — PSA: Prostate Specific Ag, Serum: 0.6 ng/mL (ref 0.0–4.0)

## 2021-12-09 ENCOUNTER — Encounter: Payer: Self-pay | Admitting: Urology

## 2021-12-12 ENCOUNTER — Other Ambulatory Visit: Payer: Self-pay | Admitting: Urology

## 2021-12-12 DIAGNOSIS — E291 Testicular hypofunction: Secondary | ICD-10-CM

## 2021-12-12 MED ORDER — TESTOSTERONE CYPIONATE 200 MG/ML IM SOLN: 200.0000 mg | INTRAMUSCULAR | 0 refills | Status: DC

## 2021-12-17 ENCOUNTER — Other Ambulatory Visit: Payer: Self-pay | Admitting: *Deleted

## 2021-12-17 ENCOUNTER — Encounter: Payer: Self-pay | Admitting: *Deleted

## 2021-12-17 MED ORDER — "BD LUER-LOK SYRINGE 21G X 1-1/2"" 3 ML MISC": 0 refills | Status: DC

## 2021-12-17 MED ORDER — "BD SAFETYGLIDE NEEDLE 18G X 1-1/2"" MISC": 0 refills | Status: DC

## 2021-12-17 NOTE — Telephone Encounter (Signed)
Pt went to pick up meds and they didn't give him any syringes.  Should they have?  Can you let pt know please? ? ?

## 2021-12-21 ENCOUNTER — Ambulatory Visit: Payer: BC Managed Care – PPO | Admitting: Physician Assistant

## 2022-01-14 ENCOUNTER — Encounter: Payer: BC Managed Care – PPO | Admitting: Physician Assistant

## 2022-02-08 ENCOUNTER — Encounter: Payer: BC Managed Care – PPO | Admitting: Physician Assistant

## 2022-02-10 ENCOUNTER — Other Ambulatory Visit: Payer: Self-pay | Admitting: Urology

## 2022-02-13 ENCOUNTER — Telehealth: Payer: Self-pay | Admitting: *Deleted

## 2022-02-13 ENCOUNTER — Other Ambulatory Visit: Payer: Self-pay | Admitting: Physician Assistant

## 2022-02-13 ENCOUNTER — Other Ambulatory Visit: Payer: Self-pay | Admitting: *Deleted

## 2022-02-13 DIAGNOSIS — E291 Testicular hypofunction: Secondary | ICD-10-CM

## 2022-02-13 NOTE — Telephone Encounter (Signed)
Notified patient as instructed, patient pleased.  Patient coming in tomorrow for labs

## 2022-02-13 NOTE — Telephone Encounter (Signed)
-----   Message from Abbie Sons, MD sent at 02/13/2022  7:43 AM EDT ----- Regarding: Testosterone level Received a refill request for testosterone and he has not had a testosterone level since starting injections.  Needs a lab visit for a mid-end cycle testosterone level

## 2022-02-14 ENCOUNTER — Other Ambulatory Visit: Payer: Self-pay

## 2022-02-14 ENCOUNTER — Other Ambulatory Visit: Payer: BC Managed Care – PPO

## 2022-02-14 DIAGNOSIS — E291 Testicular hypofunction: Secondary | ICD-10-CM

## 2022-02-15 ENCOUNTER — Encounter: Payer: Self-pay | Admitting: *Deleted

## 2022-02-15 ENCOUNTER — Telehealth: Payer: Self-pay | Admitting: Urology

## 2022-02-15 ENCOUNTER — Other Ambulatory Visit: Payer: Self-pay | Admitting: Urology

## 2022-02-15 LAB — TESTOSTERONE: Testosterone: 236 ng/dL — ABNORMAL LOW (ref 264–916)

## 2022-02-15 NOTE — Telephone Encounter (Signed)
Testosterone level still low at 236.  When it was last injection in relation to this blood draw?

## 2022-02-18 ENCOUNTER — Encounter: Payer: Self-pay | Admitting: Physician Assistant

## 2022-02-18 ENCOUNTER — Telehealth: Payer: Self-pay | Admitting: Urology

## 2022-02-18 ENCOUNTER — Ambulatory Visit (INDEPENDENT_AMBULATORY_CARE_PROVIDER_SITE_OTHER): Payer: BC Managed Care – PPO | Admitting: Physician Assistant

## 2022-02-18 VITALS — BP 135/96 | HR 90 | Temp 98.3°F | Resp 16 | Ht 73.0 in | Wt 344.0 lb

## 2022-02-18 DIAGNOSIS — Z1211 Encounter for screening for malignant neoplasm of colon: Secondary | ICD-10-CM

## 2022-02-18 DIAGNOSIS — I10 Essential (primary) hypertension: Secondary | ICD-10-CM

## 2022-02-18 DIAGNOSIS — R300 Dysuria: Secondary | ICD-10-CM

## 2022-02-18 DIAGNOSIS — Z6841 Body Mass Index (BMI) 40.0 and over, adult: Secondary | ICD-10-CM

## 2022-02-18 DIAGNOSIS — Z1212 Encounter for screening for malignant neoplasm of rectum: Secondary | ICD-10-CM

## 2022-02-18 DIAGNOSIS — Z0001 Encounter for general adult medical examination with abnormal findings: Secondary | ICD-10-CM

## 2022-02-18 MED ORDER — VERAPAMIL HCL ER 240 MG PO TBCR: 240.0000 mg | EXTENDED_RELEASE_TABLET | Freq: Every day | ORAL | 2 refills | Status: DC

## 2022-02-18 MED ORDER — SEMAGLUTIDE-WEIGHT MANAGEMENT 0.5 MG/0.5ML ~~LOC~~ SOAJ: 0.5000 mg | SUBCUTANEOUS | 0 refills | Status: DC

## 2022-02-18 NOTE — Telephone Encounter (Signed)
testosterone cypionate (DEPOTESTOSTERONE CYPIONATE) 200 MG/ML injection   [facility] [address].

## 2022-02-18 NOTE — Telephone Encounter (Signed)
Dr. Lonna Cobb sent medication in today

## 2022-02-18 NOTE — Progress Notes (Signed)
[facility] [place], [place] [address]  Internal MEDICINE  Office Visit Note  Patient Name: Daniel Cummings  [id number]  [id number]  Date of Service: 02/18/2022  Chief Complaint  Patient presents with   Annual Exam   Hypertension   Quality Metric Gaps    Cologuard/Colonoscopy     HPI Pt is here for routine health maintenance examination -Took the .25 dose of the wegovy, took the last dose about 10 days ago and would like to increase to next dose. Denies any side effects. Unfortunately is up 3lbs since last visit in March. Discussed need for improving diet and exercise as well -Has not had labs done yet, reports he had some via urology but not the labs ordered by our office and will re-print slip today for these--reminded to be fasting -He is now being followed by urology for testosterone therapy -He reports he did not do the cologuard and is interested in doing colonoscopy and will be referred at this time -BP not checked at home, but states he will get a cuff now to start monitoring. Will increase his verapamil due to BP uncontrolled in office and rose on recheck to 150/94. If increased dose not helping then may need to add alternative  Current Medication: Outpatient Encounter Medications as of 02/18/2022  Medication Sig   buPROPion (WELLBUTRIN XL) 150 MG 24 hr tablet TAKE 1 TABLET BY MOUTH EVERY DAY   doxycycline (VIBRA-TABS) 100 MG tablet Take 1 tablet (100 mg total) by mouth 2 (two) times daily.   hydrochlorothiazide (HYDRODIURIL) 25 MG tablet Take 1 tablet (25 mg total) by mouth daily.   NEEDLE, DISP, 18 G (BD SAFETYGLIDE NEEDLE) 18G X 1-1/2" MISC Draw medication up with   Semaglutide-Weight Management 0.5 MG/0.5ML SOAJ Inject 0.5 mg into the skin once a week.   sildenafil (REVATIO) 20 MG tablet Take 1 tablet (20 mg total) by mouth daily as needed.   sildenafil (VIAGRA) 100 MG tablet Take 1 tablet 1 hour prior to intercourse   SYRINGE-NEEDLE, DISP,  3 ML (B-D 3CC LUER-LOK SYR 21GX1-1/2) 21G X 1-1/2" 3 ML MISC Use this needle to injection   verapamil (CALAN-SR) 240 MG CR tablet Take 1 tablet (240 mg total) by mouth at bedtime.   [DISCONTINUED] Semaglutide-Weight Management 0.25 MG/0.5ML SOAJ Inject 0.25 mg into the skin once a week.   [DISCONTINUED] testosterone cypionate (DEPOTESTOSTERONE CYPIONATE) 200 MG/ML injection Inject 1 mL (200 mg total) into the muscle every 14 (fourteen) days.   [DISCONTINUED] verapamil (CALAN-SR) 180 MG CR tablet TAKE 1 TABLET BY MOUTH EVERY DAY   No facility-administered encounter medications on file as of 02/18/2022.    Surgical History: Past Surgical History:  Procedure Laterality Date   INGUINAL HERNIA REPAIR Left 1995    Medical History: Past Medical History:  Diagnosis Date   Hypertension     Family History: Family History  Problem Relation Age of Onset   Coronary artery disease Neg Hx    Diabetes Neg Hx    Hypertension Neg Hx       Review of Systems  Constitutional:  Negative for chills, fatigue and unexpected weight change.  HENT:  Negative for congestion, rhinorrhea, sneezing and sore throat.   Eyes:  Negative for redness.  Respiratory:  Negative for cough, chest tightness and shortness of breath.   Cardiovascular:  Negative for chest pain and palpitations.  Gastrointestinal:  Negative for abdominal pain, constipation, diarrhea, nausea and vomiting.  Genitourinary:  Negative for dysuria and  frequency.  Musculoskeletal:  Negative for arthralgias, back pain, joint swelling and neck pain.  Skin:  Negative for rash.  Neurological: Negative.  Negative for tremors and numbness.  Hematological:  Negative for adenopathy. Does not bruise/bleed easily.  Psychiatric/Behavioral:  Negative for behavioral problems (Depression), sleep disturbance and suicidal ideas. The patient is not nervous/anxious.      Vital Signs: BP (!) 135/96   Pulse 90   Temp 98.3 F (36.8 C)   Resp 16   Ht 6\' 1"   (1.854 m)   Wt (!) 344 lb (156 kg)   SpO2 94%   BMI 45.39 kg/m    Physical Exam Vitals and nursing note reviewed.  Constitutional:      General: He is not in acute distress.    Appearance: He is well-developed. He is obese. He is not diaphoretic.  HENT:     Head: Normocephalic and atraumatic.     Mouth/Throat:     Pharynx: No oropharyngeal exudate.  Eyes:     Pupils: Pupils are equal, round, and reactive to light.  Neck:     Thyroid: No thyromegaly.     Vascular: No JVD.     Trachea: No tracheal deviation.  Cardiovascular:     Rate and Rhythm: Normal rate and regular rhythm.     Heart sounds: Normal heart sounds. No murmur heard.    No friction rub. No gallop.  Pulmonary:     Effort: Pulmonary effort is normal. No respiratory distress.     Breath sounds: No wheezing or rales.  Chest:     Chest wall: No tenderness.  Abdominal:     General: Bowel sounds are normal.     Palpations: Abdomen is soft.     Tenderness: There is no abdominal tenderness.  Musculoskeletal:        General: Normal range of motion.     Cervical back: Normal range of motion and neck supple.  Lymphadenopathy:     Cervical: No cervical adenopathy.  Skin:    General: Skin is warm and dry.  Neurological:     Mental Status: He is alert and oriented to person, place, and time.     Cranial Nerves: No cranial nerve deficit.  Psychiatric:        Behavior: Behavior normal.        Thought Content: Thought content normal.        Judgment: Judgment normal.      LABS: Recent Results (from the past 2160 hour(s))  PSA     Status: None   Collection Time: 12/07/21  9:25 AM  Result Value Ref Range   Prostate Specific Ag, Serum 0.6 0.0 - 4.0 ng/mL    Comment: Roche ECLIA methodology. According to the American Urological Association, Serum PSA should decrease and remain at undetectable levels after radical prostatectomy. The AUA defines biochemical recurrence as an initial PSA value 0.2 ng/mL or greater  followed by a subsequent confirmatory PSA value 0.2 ng/mL or greater. Values obtained with different assay methods or kits cannot be used interchangeably. Results cannot be interpreted as absolute evidence of the presence or absence of malignant disease.   Luteinizing hormone     Status: None   Collection Time: 12/07/21  9:25 AM  Result Value Ref Range   LH 3.8 1.7 - 8.6 mIU/mL  Testosterone     Status: Abnormal   Collection Time: 12/07/21  9:25 AM  Result Value Ref Range   Testosterone 166 (L) 264 - 916 ng/dL  Comment: Adult male reference interval is based on a population of healthy nonobese males (BMI <30) between 30 and 56 years old. [place], [place] [phone]. PMID: [id number].   Testosterone     Status: Abnormal   Collection Time: 02/14/22  3:37 PM  Result Value Ref Range   Testosterone 236 (L) 264 - 916 ng/dL    Comment: Adult male reference interval is based on a population of healthy nonobese males (BMI <30) between 53 and 51 years old. [place], [place] [phone]. PMID: [id number].         Assessment/Plan: 1. Encounter for general adult medical examination with abnormal findings CPE performed, pt will have routine fasting labs previously ordered, due for colonoscopy  2. Essential hypertension Will increase verapamil and continue hctz and start monitoring at home - verapamil (CALAN-SR) 240 MG CR tablet; Take 1 tablet (240 mg total) by mouth at bedtime.  Dispense: 30 tablet; Refill: 2  3. Screening for colorectal cancer - Ambulatory referral to Gastroenterology  4. Morbid obesity with BMI of 45.0-49.9, adult ([facility]) Will increase to next dose of wegovy and continue to work on diet and exercise - Semaglutide-Weight Management 0.5 MG/0.5ML SOAJ; Inject 0.5 mg into the skin once a week.  Dispense: 2 mL; Refill: 0  5. Dysuria - UA/M w/rflx Culture, Routine   General Counseling: xachary grenell understanding of the findings of todays  visit and agrees with plan of treatment. I have discussed any further diagnostic evaluation that may be needed or ordered today. We also reviewed his medications today. he has been encouraged to call the office with any questions or concerns that should arise related to todays visit.    Counseling:    Orders Placed This Encounter  Procedures   UA/M w/rflx Culture, Routine   Ambulatory referral to Gastroenterology    Meds ordered this encounter  Medications   Semaglutide-Weight Management 0.5 MG/0.5ML SOAJ    Sig: Inject 0.5 mg into the skin once a week.    Dispense:  2 mL    Refill:  0   verapamil (CALAN-SR) 240 MG CR tablet    Sig: Take 1 tablet (240 mg total) by mouth at bedtime.    Dispense:  30 tablet    Refill:  2    This patient was seen by Drema Dallas, PA-C in collaboration with Dr. Clayborn Bigness as a part of collaborative care agreement.  Total time spent:35 Minutes  Time spent includes review of chart, medications, test results, and follow up plan with the patient.     Lavera Guise, MD  Internal Medicine

## 2022-02-19 LAB — UA/M W/RFLX CULTURE, ROUTINE
Bilirubin, UA: NEGATIVE
Glucose, UA: NEGATIVE
Ketones, UA: NEGATIVE
Leukocytes,UA: NEGATIVE
Nitrite, UA: NEGATIVE
Protein,UA: NEGATIVE
RBC, UA: NEGATIVE
Specific Gravity, UA: 1.021 (ref 1.005–1.030)
Urobilinogen, Ur: 0.2 mg/dL (ref 0.2–1.0)
pH, UA: 7 (ref 5.0–7.5)

## 2022-02-19 LAB — MICROSCOPIC EXAMINATION
Bacteria, UA: NONE SEEN
Casts: NONE SEEN /lpf
Epithelial Cells (non renal): NONE SEEN /hpf (ref 0–10)
RBC, Urine: NONE SEEN /hpf (ref 0–2)
WBC, UA: NONE SEEN /hpf (ref 0–5)

## 2022-02-21 ENCOUNTER — Telehealth: Payer: Self-pay

## 2022-02-21 NOTE — Telephone Encounter (Signed)
CALLED PATIENT NO ANSWER LEFT VOICEMAIL FOR A CALL BACK °Letter sent °

## 2022-03-22 ENCOUNTER — Ambulatory Visit: Payer: BC Managed Care – PPO | Admitting: Physician Assistant

## 2022-05-08 ENCOUNTER — Other Ambulatory Visit: Payer: Self-pay | Admitting: Physician Assistant

## 2022-05-18 ENCOUNTER — Other Ambulatory Visit: Payer: Self-pay | Admitting: Physician Assistant

## 2022-05-18 DIAGNOSIS — I10 Essential (primary) hypertension: Secondary | ICD-10-CM

## 2022-05-23 ENCOUNTER — Other Ambulatory Visit: Payer: Self-pay

## 2022-05-23 ENCOUNTER — Other Ambulatory Visit: Payer: BC Managed Care – PPO

## 2022-05-23 ENCOUNTER — Encounter: Payer: Self-pay | Admitting: Urology

## 2022-05-23 DIAGNOSIS — N5201 Erectile dysfunction due to arterial insufficiency: Secondary | ICD-10-CM

## 2022-05-23 DIAGNOSIS — E291 Testicular hypofunction: Secondary | ICD-10-CM

## 2022-05-29 ENCOUNTER — Other Ambulatory Visit: Payer: Self-pay | Admitting: Physician Assistant

## 2022-06-27 ENCOUNTER — Other Ambulatory Visit: Payer: Self-pay | Admitting: Urology

## 2022-07-03 NOTE — Telephone Encounter (Signed)
Patient has not been seen since starting testosterone therapy.  Needs a follow-up office visit

## 2022-10-29 ENCOUNTER — Encounter: Payer: Self-pay | Admitting: Nurse Practitioner

## 2022-10-29 ENCOUNTER — Ambulatory Visit (INDEPENDENT_AMBULATORY_CARE_PROVIDER_SITE_OTHER): Payer: BC Managed Care – PPO | Admitting: Nurse Practitioner

## 2022-10-29 VITALS — BP 140/88 | HR 78 | Temp 98.5°F | Resp 16 | Ht 73.0 in | Wt 296.2 lb

## 2022-10-29 DIAGNOSIS — N529 Male erectile dysfunction, unspecified: Secondary | ICD-10-CM

## 2022-10-29 DIAGNOSIS — L739 Follicular disorder, unspecified: Secondary | ICD-10-CM | POA: Diagnosis not present

## 2022-10-29 DIAGNOSIS — E559 Vitamin D deficiency, unspecified: Secondary | ICD-10-CM

## 2022-10-29 DIAGNOSIS — I10 Essential (primary) hypertension: Secondary | ICD-10-CM | POA: Diagnosis not present

## 2022-10-29 DIAGNOSIS — E782 Mixed hyperlipidemia: Secondary | ICD-10-CM

## 2022-10-29 DIAGNOSIS — Z6841 Body Mass Index (BMI) 40.0 and over, adult: Secondary | ICD-10-CM

## 2022-10-29 MED ORDER — SILDENAFIL CITRATE 100 MG PO TABS: ORAL_TABLET | ORAL | 3 refills | Status: DC

## 2022-10-29 MED ORDER — DOXYCYCLINE HYCLATE 100 MG PO TABS: 100.0000 mg | ORAL_TABLET | Freq: Two times a day (BID) | ORAL | 0 refills | Status: DC

## 2022-10-29 MED ORDER — VERAPAMIL HCL ER 240 MG PO TBCR: 240.0000 mg | EXTENDED_RELEASE_TABLET | Freq: Every day | ORAL | 1 refills | Status: DC

## 2022-10-29 MED ORDER — PHENTERMINE HCL 37.5 MG PO TABS: 37.5000 mg | ORAL_TABLET | Freq: Every day | ORAL | 0 refills | Status: DC

## 2022-10-29 MED ORDER — HYDROCHLOROTHIAZIDE 25 MG PO TABS: 25.0000 mg | ORAL_TABLET | Freq: Every day | ORAL | 0 refills | Status: DC

## 2022-10-29 NOTE — Progress Notes (Signed)
[facility] [place], [place] [address]  Internal MEDICINE  Office Visit Note  Patient Name: Daniel Cummings  [id number]  [id number]  Date of Service: 10/29/2022  Chief Complaint  Patient presents with   Follow-up    HPI Daniel Cummings presents for a follow-up visit for refills and routine follow up for labs.  Still has not had labs drawn ordered by our office  Was supposed to follow up in July last year but has not been seen since June last year BP controlled omn current medications Wants to try phentermine for weight loss.  Requesting refill of viagra.     Current Medication: Outpatient Encounter Medications as of 10/29/2022  Medication Sig   NEEDLE, DISP, 18 G (BD SAFETYGLIDE NEEDLE) 18G X 1-1/2" MISC Draw medication up with   phentermine (ADIPEX-P) 37.5 MG tablet Take 1 tablet (37.5 mg total) by mouth daily before breakfast.   Semaglutide-Weight Management 0.5 MG/0.5ML SOAJ Inject 0.5 mg into the skin once a week.   SYRINGE-NEEDLE, DISP, 3 ML (B-D 3CC LUER-LOK SYR 21GX1-1/2) 21G X 1-1/2" 3 ML MISC Use this needle to injection   testosterone cypionate (DEPOTESTOSTERONE CYPIONATE) 200 MG/ML injection INJECT 1 ML (200 MG TOTAL) INTO THE MUSCLE EVERY 14 DAYS   [DISCONTINUED] buPROPion (WELLBUTRIN XL) 150 MG 24 hr tablet TAKE 1 TABLET BY MOUTH EVERY DAY   [DISCONTINUED] doxycycline (VIBRA-TABS) 100 MG tablet Take 1 tablet (100 mg total) by mouth 2 (two) times daily.   [DISCONTINUED] hydrochlorothiazide (HYDRODIURIL) 25 MG tablet Take 1 tablet (25 mg total) by mouth daily.   [DISCONTINUED] sildenafil (REVATIO) 20 MG tablet Take 1 tablet (20 mg total) by mouth daily as needed.   [DISCONTINUED] sildenafil (VIAGRA) 100 MG tablet Take 1 tablet 1 hour prior to intercourse   [DISCONTINUED] verapamil (CALAN-SR) 240 MG CR tablet TAKE 1 TABLET BY MOUTH AT BEDTIME.   doxycycline (VIBRA-TABS) 100 MG tablet Take 1 tablet (100 mg total) by mouth 2 (two) times daily.    hydrochlorothiazide (HYDRODIURIL) 25 MG tablet Take 1 tablet (25 mg total) by mouth daily.   sildenafil (VIAGRA) 100 MG tablet Take 1 tablet 1 hour prior to intercourse   verapamil (CALAN-SR) 240 MG CR tablet Take 1 tablet (240 mg total) by mouth at bedtime.   No facility-administered encounter medications on file as of 10/29/2022.    Surgical History: Past Surgical History:  Procedure Laterality Date   INGUINAL HERNIA REPAIR Left 1995    Medical History: Past Medical History:  Diagnosis Date   Hypertension     Family History: Family History  Problem Relation Age of Onset   Coronary artery disease Neg Hx    Diabetes Neg Hx    Hypertension Neg Hx     Social History   Socioeconomic History   Marital status: Married    Spouse name: Not on file   Number of children: Not on file   Years of education: Not on file   Highest education level: Not on file  Occupational History   Not on file  Tobacco Use   Smoking status: Never   Smokeless tobacco: Never  Vaping Use   Vaping Use: Never used  Substance and Sexual Activity   Alcohol use: No   Drug use: No   Sexual activity: Not on file  Other Topics Concern   Not on file  Social History Narrative   Not on file   Social Determinants of Health   Financial Resource Strain: Not on file  Food Insecurity: Not on file  Transportation Needs: Not on file  Physical Activity: Not on file  Stress: Not on file  Social Connections: Not on file  Intimate Partner Violence: Not on file      Review of Systems  Constitutional:  Negative for chills, fatigue and unexpected weight change.  HENT:  Negative for congestion, postnasal drip, rhinorrhea, sneezing and sore throat.   Eyes:  Negative for redness.  Respiratory:  Negative for cough, chest tightness and shortness of breath.   Cardiovascular:  Negative for chest pain and palpitations.  Gastrointestinal:  Negative for abdominal pain, constipation, diarrhea, nausea and vomiting.   Genitourinary:  Negative for dysuria and frequency.       ED  Musculoskeletal:  Negative for arthralgias, back pain, joint swelling and neck pain.  Skin:  Negative for rash.  Neurological: Negative.  Negative for tremors and numbness.  Hematological:  Negative for adenopathy. Does not bruise/bleed easily.  Psychiatric/Behavioral:  Negative for behavioral problems (Depression), sleep disturbance and suicidal ideas. The patient is not nervous/anxious.     Vital Signs: BP (!) 140/88   Pulse 78   Temp 98.5 F (36.9 C)   Resp 16   Ht '6\' 1"'$  (1.854 m)   Wt 296 lb 3.2 oz (134.4 kg)   SpO2 95%   BMI 39.08 kg/m    Physical Exam Vitals reviewed.  Constitutional:      General: He is not in acute distress.    Appearance: He is well-developed. He is obese. He is not diaphoretic.  HENT:     Head: Normocephalic and atraumatic.     Mouth/Throat:     Pharynx: No oropharyngeal exudate.  Eyes:     Pupils: Pupils are equal, round, and reactive to light.  Neck:     Thyroid: No thyromegaly.     Vascular: No JVD.     Trachea: No tracheal deviation.  Cardiovascular:     Rate and Rhythm: Normal rate and regular rhythm.     Heart sounds: Normal heart sounds. No murmur heard.    No friction rub. No gallop.  Pulmonary:     Effort: Pulmonary effort is normal. No respiratory distress.     Breath sounds: No wheezing or rales.  Chest:     Chest wall: No tenderness.  Abdominal:     General: Bowel sounds are normal.     Palpations: Abdomen is soft.  Musculoskeletal:        General: Normal range of motion.     Cervical back: Normal range of motion and neck supple.  Lymphadenopathy:     Cervical: No cervical adenopathy.  Skin:    General: Skin is warm and dry.  Neurological:     Mental Status: He is alert and oriented to person, place, and time.     Cranial Nerves: No cranial nerve deficit.  Psychiatric:        Behavior: Behavior normal.        Thought Content: Thought content normal.         Judgment: Judgment normal.        Assessment/Plan: 1. Essential hypertension Continue current medications as prescribed, refills ordered Routine labs ordered.  - Lipid Profile - CBC with Differential/Platelet - CMP14+EGFR - TSH + free T4 - verapamil (CALAN-SR) 240 MG CR tablet; Take 1 tablet (240 mg total) by mouth at bedtime.  Dispense: 90 tablet; Refill: 1 - hydrochlorothiazide (HYDRODIURIL) 25 MG tablet; Take 1 tablet (25 mg total) by mouth daily.  Dispense:  90 tablet; Refill: 0  2. Morbid obesity with BMI of 45.0-49.9, adult (HCC) Routine labs ordered, phentermine ordered x1 fill, follow up with Lauren PA-C PCP in 4 weeks  - Lipid Profile - CBC with Differential/Platelet - CMP14+EGFR - TSH + free T4 - phentermine (ADIPEX-P) 37.5 MG tablet; Take 1 tablet (37.5 mg total) by mouth daily before breakfast.  Dispense: 30 tablet; Refill: 0  3. Folliculitis Doxycycline prescribed.  - doxycycline (VIBRA-TABS) 100 MG tablet; Take 1 tablet (100 mg total) by mouth 2 (two) times daily.  Dispense: 14 tablet; Refill: 0  4. Mixed hyperlipidemia Routine labs ordered - Lipid Profile - CBC with Differential/Platelet - CMP14+EGFR - TSH + free T4  5. Erectile dysfunction, unspecified erectile dysfunction type Routine labs ordered, refill sildenafil.  - TSH + free T4 - sildenafil (VIAGRA) 100 MG tablet; Take 1 tablet 1 hour prior to intercourse  Dispense: 30 tablet; Refill: 3   General Counseling: Ysabel verbalizes understanding of the findings of todays visit and agrees with plan of treatment. I have discussed any further diagnostic evaluation that may be needed or ordered today. We also reviewed his medications today. he has been encouraged to call the office with any questions or concerns that should arise related to todays visit.    Orders Placed This Encounter  Procedures   Lipid Profile   CBC with Differential/Platelet   CMP14+EGFR   TSH + free T4    Meds ordered this  encounter  Medications   verapamil (CALAN-SR) 240 MG CR tablet    Sig: Take 1 tablet (240 mg total) by mouth at bedtime.    Dispense:  90 tablet    Refill:  1   sildenafil (VIAGRA) 100 MG tablet    Sig: Take 1 tablet 1 hour prior to intercourse    Dispense:  30 tablet    Refill:  3   hydrochlorothiazide (HYDRODIURIL) 25 MG tablet    Sig: Take 1 tablet (25 mg total) by mouth daily.    Dispense:  90 tablet    Refill:  0   doxycycline (VIBRA-TABS) 100 MG tablet    Sig: Take 1 tablet (100 mg total) by mouth 2 (two) times daily.    Dispense:  14 tablet    Refill:  0   phentermine (ADIPEX-P) 37.5 MG tablet    Sig: Take 1 tablet (37.5 mg total) by mouth daily before breakfast.    Dispense:  30 tablet    Refill:  0    Run through insurance or goodrx    Return in about 4 weeks (around 11/26/2022) for F/U, Weight loss with lauren PCP.   Total time spent:30 Minutes Time spent includes review of chart, medications, test results, and follow up plan with the patient.   [place] Controlled Substance Database was reviewed by me.  This patient was seen by Jonetta Osgood, FNP-C in collaboration with Dr. Clayborn Bigness as a part of collaborative care agreement.   Infantof Villagomez R. Valetta Fuller, MSN, FNP-C Internal medicine

## 2022-10-31 ENCOUNTER — Other Ambulatory Visit: Payer: Self-pay | Admitting: Nurse Practitioner

## 2022-10-31 NOTE — Telephone Encounter (Signed)
Viagra 100 mg prn qd # 14 3 refills ( let the pt know its not covered and changing it to Viagra

## 2022-10-31 NOTE — Telephone Encounter (Signed)
Please review viagra is not covered

## 2022-10-31 NOTE — Telephone Encounter (Signed)
Please review not covered

## 2022-11-09 ENCOUNTER — Encounter: Payer: Self-pay | Admitting: Nurse Practitioner

## 2022-11-28 ENCOUNTER — Ambulatory Visit: Payer: BC Managed Care – PPO | Admitting: Physician Assistant

## 2023-02-01 ENCOUNTER — Other Ambulatory Visit: Payer: Self-pay | Admitting: Nurse Practitioner

## 2023-02-01 DIAGNOSIS — I10 Essential (primary) hypertension: Secondary | ICD-10-CM

## 2023-02-24 ENCOUNTER — Other Ambulatory Visit: Payer: Self-pay

## 2023-02-24 ENCOUNTER — Encounter: Payer: Self-pay | Admitting: Physician Assistant

## 2023-02-24 ENCOUNTER — Ambulatory Visit (INDEPENDENT_AMBULATORY_CARE_PROVIDER_SITE_OTHER): Payer: BC Managed Care – PPO | Admitting: Physician Assistant

## 2023-02-24 ENCOUNTER — Telehealth: Payer: Self-pay | Admitting: Physician Assistant

## 2023-02-24 VITALS — BP 130/80 | HR 90 | Temp 98.4°F | Resp 16 | Ht 73.0 in | Wt 307.8 lb

## 2023-02-24 DIAGNOSIS — Z1211 Encounter for screening for malignant neoplasm of colon: Secondary | ICD-10-CM | POA: Diagnosis not present

## 2023-02-24 DIAGNOSIS — R300 Dysuria: Secondary | ICD-10-CM

## 2023-02-24 DIAGNOSIS — I10 Essential (primary) hypertension: Secondary | ICD-10-CM

## 2023-02-24 DIAGNOSIS — M25562 Pain in left knee: Secondary | ICD-10-CM

## 2023-02-24 DIAGNOSIS — L739 Follicular disorder, unspecified: Secondary | ICD-10-CM | POA: Diagnosis not present

## 2023-02-24 DIAGNOSIS — N529 Male erectile dysfunction, unspecified: Secondary | ICD-10-CM

## 2023-02-24 DIAGNOSIS — M25561 Pain in right knee: Secondary | ICD-10-CM

## 2023-02-24 DIAGNOSIS — Z6841 Body Mass Index (BMI) 40.0 and over, adult: Secondary | ICD-10-CM

## 2023-02-24 DIAGNOSIS — Z0001 Encounter for general adult medical examination with abnormal findings: Secondary | ICD-10-CM

## 2023-02-24 DIAGNOSIS — G8929 Other chronic pain: Secondary | ICD-10-CM

## 2023-02-24 DIAGNOSIS — Z1212 Encounter for screening for malignant neoplasm of rectum: Secondary | ICD-10-CM

## 2023-02-24 MED ORDER — SILDENAFIL CITRATE 100 MG PO TABS: ORAL_TABLET | ORAL | 0 refills | Status: DC

## 2023-02-24 MED ORDER — PHENTERMINE HCL 37.5 MG PO TABS: 37.5000 mg | ORAL_TABLET | Freq: Every day | ORAL | 0 refills | Status: DC

## 2023-02-24 MED ORDER — DOXYCYCLINE HYCLATE 100 MG PO TABS: 100.0000 mg | ORAL_TABLET | Freq: Two times a day (BID) | ORAL | 0 refills | Status: DC

## 2023-02-24 NOTE — Progress Notes (Signed)
[facility] [address] [place], [place] [address]  Internal MEDICINE  Office Visit Note  Patient Name: Daniel Cummings  [id number]  [id number]  Date of Service: 02/24/2023  Chief Complaint  Patient presents with   Annual Exam   Hypertension     HPI Pt is here for routine health maintenance examination -Bp 130/80 at home when he checks, took verapamil and will take hydrochlorothiazide before bed -will call to schedule with urology for his testosterone replacement -has some pain in his knees, worse after he works out. Will use tylenol/ibuprofen as needed. Would like to see ortho for possible injections -No S/E with phentermine and would like to try this again to aid his weight loss goals -Never scheduled colonoscopy last year and is ready now--new referral sent -labs ordered last visit and will get these done  Current Medication: Outpatient Encounter Medications as of 02/24/2023  Medication Sig   hydrochlorothiazide (HYDRODIURIL) 25 MG tablet TAKE 1 TABLET (25 MG TOTAL) BY MOUTH DAILY.   NEEDLE, DISP, 18 G (BD SAFETYGLIDE NEEDLE) 18G X 1-1/2" MISC Draw medication up with   sildenafil (VIAGRA) 100 MG tablet Take 1 tablet 1 hour prior to intercourse   testosterone cypionate (DEPOTESTOSTERONE CYPIONATE) 200 MG/ML injection INJECT 1 ML (200 MG TOTAL) INTO THE MUSCLE EVERY 14 DAYS   verapamil (CALAN-SR) 240 MG CR tablet Take 1 tablet (240 mg total) by mouth at bedtime.   [DISCONTINUED] doxycycline (VIBRA-TABS) 100 MG tablet Take 1 tablet (100 mg total) by mouth 2 (two) times daily.   [DISCONTINUED] phentermine (ADIPEX-P) 37.5 MG tablet Take 1 tablet (37.5 mg total) by mouth daily before breakfast.   [DISCONTINUED] Semaglutide-Weight Management 0.5 MG/0.5ML SOAJ Inject 0.5 mg into the skin once a week.   doxycycline (VIBRA-TABS) 100 MG tablet Take 1 tablet (100 mg total) by mouth 2 (two) times daily.   phentermine (ADIPEX-P) 37.5 MG tablet Take 1 tablet (37.5 mg total)  by mouth daily before breakfast.   No facility-administered encounter medications on file as of 02/24/2023.    Surgical History: Past Surgical History:  Procedure Laterality Date   INGUINAL HERNIA REPAIR Left 1995    Medical History: Past Medical History:  Diagnosis Date   Hypertension     Family History: Family History  Problem Relation Age of Onset   Coronary artery disease Neg Hx    Diabetes Neg Hx    Hypertension Neg Hx       Review of Systems  Constitutional:  Negative for chills, fatigue and unexpected weight change.  HENT:  Negative for congestion, rhinorrhea, sneezing and sore throat.   Eyes:  Negative for redness.  Respiratory:  Negative for cough, chest tightness and shortness of breath.   Cardiovascular:  Negative for chest pain and palpitations.  Gastrointestinal:  Negative for abdominal pain, constipation, diarrhea, nausea and vomiting.  Genitourinary:  Negative for dysuria and frequency.  Musculoskeletal:  Positive for arthralgias. Negative for back pain, joint swelling and neck pain.  Skin:  Negative for rash.  Neurological: Negative.  Negative for tremors and numbness.  Hematological:  Negative for adenopathy. Does not bruise/bleed easily.  Psychiatric/Behavioral:  Negative for behavioral problems (Depression), sleep disturbance and suicidal ideas. The patient is not nervous/anxious.      Vital Signs: BP 130/80 Comment: 135/90  Pulse 90 Comment: 110  Temp 98.4 F (36.9 C)   Resp 16   Ht 6\' 1"  (1.854 m)   Wt (!) 307 lb 12.8 oz (139.6 kg)   SpO2 97%  BMI 40.61 kg/m    Physical Exam Vitals and nursing note reviewed.  Constitutional:      General: He is not in acute distress.    Appearance: He is well-developed. He is obese. He is not diaphoretic.  HENT:     Head: Normocephalic and atraumatic.     Mouth/Throat:     Pharynx: No oropharyngeal exudate.  Eyes:     Pupils: Pupils are equal, round, and reactive to light.  Neck:     Thyroid: No  thyromegaly.     Vascular: No JVD.     Trachea: No tracheal deviation.  Cardiovascular:     Rate and Rhythm: Normal rate and regular rhythm.     Heart sounds: Normal heart sounds. No murmur heard.    No friction rub. No gallop.  Pulmonary:     Effort: Pulmonary effort is normal. No respiratory distress.     Breath sounds: No wheezing or rales.  Chest:     Chest wall: No tenderness.  Abdominal:     General: Bowel sounds are normal.     Palpations: Abdomen is soft.     Tenderness: There is no abdominal tenderness.  Musculoskeletal:        General: Normal range of motion.     Cervical back: Normal range of motion and neck supple.  Lymphadenopathy:     Cervical: No cervical adenopathy.  Skin:    General: Skin is warm and dry.  Neurological:     Mental Status: He is alert and oriented to person, place, and time.     Cranial Nerves: No cranial nerve deficit.  Psychiatric:        Behavior: Behavior normal.        Thought Content: Thought content normal.        Judgment: Judgment normal.      LABS: No results found for this or any previous visit (from the past 2160 hour(s)).      Assessment/Plan: 1. Encounter for general adult medical examination with abnormal findings CPE performed, will have labs done  2. Essential hypertension Stable at home, continue current medications  3. Chronic pain of both knees - Ambulatory referral to Orthopedics  4. Folliculitis - doxycycline (VIBRA-TABS) 100 MG tablet; Take 1 tablet (100 mg total) by mouth 2 (two) times daily.  Dispense: 14 tablet; Refill: 0  5. Screening for colorectal cancer - Ambulatory referral to Gastroenterology  6. Morbid obesity with BMI of 40.0-44.9, adult (HCC) - phentermine (ADIPEX-P) 37.5 MG tablet; Take 1 tablet (37.5 mg total) by mouth daily before breakfast.  Dispense: 30 tablet; Refill: 0 Obesity Counseling: Had a lengthy discussion regarding patients BMI and weight issues. Patient was instructed on  portion control as well as increased activity. Also discussed caloric restrictions with trying to maintain intake less than 2000 Kcal. Discussions were made in accordance with the 5As of weight management. Simple actions such as not eating late and if able to, taking a walk is suggested.  7. Dysuria - UA/M w/rflx Culture, Routine   General Counseling: heraclio fickle understanding of the findings of todays visit and agrees with plan of treatment. I have discussed any further diagnostic evaluation that may be needed or ordered today. We also reviewed his medications today. he has been encouraged to call the office with any questions or concerns that should arise related to todays visit.    Counseling:    Orders Placed This Encounter  Procedures   UA/M w/rflx Culture, Routine   Ambulatory referral  to Gastroenterology   Ambulatory referral to Orthopedics    Meds ordered this encounter  Medications   doxycycline (VIBRA-TABS) 100 MG tablet    Sig: Take 1 tablet (100 mg total) by mouth 2 (two) times daily.    Dispense:  14 tablet    Refill:  0   phentermine (ADIPEX-P) 37.5 MG tablet    Sig: Take 1 tablet (37.5 mg total) by mouth daily before breakfast.    Dispense:  30 tablet    Refill:  0    Run through insurance or goodrx    This patient was seen by Lynn Ito, PA-C in collaboration with Dr. Beverely Risen as a part of collaborative care agreement.  Total time spent:35 Minutes  Time spent includes review of chart, medications, test results, and follow up plan with the patient.     Lyndon Code, MD  Internal Medicine

## 2023-02-24 NOTE — Telephone Encounter (Signed)
Pt stated that he will call us back to schedule 4 week follow up-nm

## 2023-02-25 ENCOUNTER — Telehealth: Payer: Self-pay | Admitting: Physician Assistant

## 2023-02-25 LAB — UA/M W/RFLX CULTURE, ROUTINE
Bilirubin, UA: NEGATIVE
Glucose, UA: NEGATIVE
Leukocytes,UA: NEGATIVE
Nitrite, UA: NEGATIVE
Protein,UA: NEGATIVE
RBC, UA: NEGATIVE
Specific Gravity, UA: 1.027 (ref 1.005–1.030)
Urobilinogen, Ur: 1 mg/dL (ref 0.2–1.0)
pH, UA: 5.5 (ref 5.0–7.5)

## 2023-02-25 LAB — MICROSCOPIC EXAMINATION
Bacteria, UA: NONE SEEN
Casts: NONE SEEN /lpf
Epithelial Cells (non renal): NONE SEEN /hpf (ref 0–10)

## 2023-02-25 NOTE — Telephone Encounter (Signed)
Orthopedic referral sent via [facility] to [facility]-Toni 

## 2023-03-07 ENCOUNTER — Encounter: Payer: Self-pay | Admitting: *Deleted

## 2023-03-18 ENCOUNTER — Telehealth: Payer: Self-pay | Admitting: Physician Assistant

## 2023-03-18 NOTE — Telephone Encounter (Signed)
Followed up with patient on orthopedic referral. I told him Daniel Cummings has attempted several times to schedule appointment. I gave him their telephone # so he can call to schedule-Daniel Cummings

## 2023-03-20 ENCOUNTER — Telehealth: Payer: Self-pay | Admitting: Physician Assistant

## 2023-03-20 NOTE — Telephone Encounter (Signed)
Unable to reach patient by phone, sent message for patient following up on GI referral sent out 02/24/23-Toni

## 2023-03-26 ENCOUNTER — Encounter: Payer: Self-pay | Admitting: *Deleted

## 2023-03-27 ENCOUNTER — Encounter: Payer: Self-pay | Admitting: *Deleted

## 2023-04-01 ENCOUNTER — Telehealth: Payer: Self-pay | Admitting: Physician Assistant

## 2023-04-01 NOTE — Telephone Encounter (Signed)
All office notes, labs and med list faxed to [facility] [place]; [phone]

## 2023-04-07 ENCOUNTER — Telehealth: Payer: Self-pay | Admitting: Physician Assistant

## 2023-04-07 NOTE — Telephone Encounter (Signed)
Per Herbert Seta w/ [facility], referral has been closed due to patient not replying to calls to schedule-Toni

## 2023-05-20 ENCOUNTER — Other Ambulatory Visit: Payer: Self-pay | Admitting: Physician Assistant

## 2023-07-28 ENCOUNTER — Other Ambulatory Visit: Payer: Self-pay | Admitting: *Deleted

## 2023-07-28 ENCOUNTER — Other Ambulatory Visit: Payer: BC Managed Care – PPO

## 2023-07-28 DIAGNOSIS — E291 Testicular hypofunction: Secondary | ICD-10-CM

## 2023-07-28 DIAGNOSIS — R972 Elevated prostate specific antigen [PSA]: Secondary | ICD-10-CM

## 2023-07-29 LAB — TESTOSTERONE: Testosterone: 297 ng/dL (ref 264–916)

## 2023-07-29 LAB — HEMATOCRIT: Hematocrit: 42.6 % (ref 37.5–51.0)

## 2023-07-29 LAB — PSA: Prostate Specific Ag, Serum: 0.7 ng/mL (ref 0.0–4.0)

## 2023-08-01 ENCOUNTER — Ambulatory Visit (INDEPENDENT_AMBULATORY_CARE_PROVIDER_SITE_OTHER): Payer: BC Managed Care – PPO | Admitting: Physician Assistant

## 2023-08-01 ENCOUNTER — Encounter: Payer: Self-pay | Admitting: Physician Assistant

## 2023-08-01 VITALS — BP 130/60 | HR 100 | Temp 98.4°F | Resp 16 | Ht 73.0 in | Wt 330.2 lb

## 2023-08-01 DIAGNOSIS — Z1329 Encounter for screening for other suspected endocrine disorder: Secondary | ICD-10-CM

## 2023-08-01 DIAGNOSIS — R5383 Other fatigue: Secondary | ICD-10-CM

## 2023-08-01 DIAGNOSIS — E782 Mixed hyperlipidemia: Secondary | ICD-10-CM

## 2023-08-01 DIAGNOSIS — Z6841 Body Mass Index (BMI) 40.0 and over, adult: Secondary | ICD-10-CM

## 2023-08-01 DIAGNOSIS — M7712 Lateral epicondylitis, left elbow: Secondary | ICD-10-CM | POA: Diagnosis not present

## 2023-08-01 DIAGNOSIS — N529 Male erectile dysfunction, unspecified: Secondary | ICD-10-CM

## 2023-08-01 MED ORDER — SILDENAFIL CITRATE 100 MG PO TABS: ORAL_TABLET | ORAL | 0 refills | Status: DC

## 2023-08-01 MED ORDER — PHENTERMINE HCL 37.5 MG PO TABS
37.5000 mg | ORAL_TABLET | Freq: Every day | ORAL | 0 refills | Status: DC
Start: 2023-08-01 — End: 2023-08-29

## 2023-08-01 NOTE — Progress Notes (Signed)
[facility] [address] [place], [place] [address]  Internal MEDICINE  Office Visit Note  Patient Name: Daniel Cummings  [id number]  [id number]  Date of Service: 08/13/2023  Chief Complaint  Patient presents with   Follow-up   Hypertension   Elbow Pain    Left elbow pain for 10 days   Quality Metric Gaps    Cologuard and Shingles Vaccine due   Medication Refill    Phentermine    HPI Pt is here for routine follow up -States he fell out of routine for trying to lose weight and missed follow up, therefore has not been seen in 5months -Exercising some, walking and lifting 4 days per week -eating out a lot, will work on this -he would like to try phentermine again to help kick start weight loss -Also due to labs and will reorder -needs to call GI for colonoscopy -knees had been doing better so didn't go to ortho. Left elbow starting to ache with ROM. Has been icing. Appears consistent with tendonitis on exam and suggested rest, ice, anti-inflammatory and considering a brace   Current Medication: Outpatient Encounter Medications as of 08/01/2023  Medication Sig   hydrochlorothiazide (HYDRODIURIL) 25 MG tablet TAKE 1 TABLET (25 MG TOTAL) BY MOUTH DAILY.   NEEDLE, DISP, 18 G (BD SAFETYGLIDE NEEDLE) 18G X 1-1/2" MISC Draw medication up with   testosterone cypionate (DEPOTESTOSTERONE CYPIONATE) 200 MG/ML injection INJECT 1 ML (200 MG TOTAL) INTO THE MUSCLE EVERY 14 DAYS   verapamil (CALAN-SR) 240 MG CR tablet Take 1 tablet (240 mg total) by mouth at bedtime.   [DISCONTINUED] doxycycline (VIBRA-TABS) 100 MG tablet Take 1 tablet (100 mg total) by mouth 2 (two) times daily.   [DISCONTINUED] phentermine (ADIPEX-P) 37.5 MG tablet Take 1 tablet (37.5 mg total) by mouth daily before breakfast.   [DISCONTINUED] sildenafil (VIAGRA) 100 MG tablet Take 1 tablet 1 hour prior to intercourse   phentermine (ADIPEX-P) 37.5 MG tablet Take 1 tablet (37.5 mg total) by mouth daily before  breakfast.   sildenafil (VIAGRA) 100 MG tablet Take 1 tablet 1 hour prior to intercourse   No facility-administered encounter medications on file as of 08/01/2023.    Surgical History: Past Surgical History:  Procedure Laterality Date   INGUINAL HERNIA REPAIR Left 1995    Medical History: Past Medical History:  Diagnosis Date   Hypertension     Family History: Family History  Problem Relation Age of Onset   Coronary artery disease Neg Hx    Diabetes Neg Hx    Hypertension Neg Hx     Social History   Socioeconomic History   Marital status: Married    Spouse name: Not on file   Number of children: Not on file   Years of education: Not on file   Highest education level: Not on file  Occupational History   Not on file  Tobacco Use   Smoking status: Never   Smokeless tobacco: Never  Vaping Use   Vaping status: Never Used  Substance and Sexual Activity   Alcohol use: No   Drug use: No   Sexual activity: Not on file  Other Topics Concern   Not on file  Social History Narrative   Not on file   Social Determinants of Health   Financial Resource Strain: Not on file  Food Insecurity: Not on file  Transportation Needs: Not on file  Physical Activity: Not on file  Stress: Not on file  Social Connections: Not on  file  Intimate Partner Violence: Not on file      Review of Systems  Constitutional:  Negative for chills, fatigue and unexpected weight change.  HENT:  Negative for congestion, rhinorrhea, sneezing and sore throat.   Eyes:  Negative for redness.  Respiratory:  Negative for cough, chest tightness and shortness of breath.   Cardiovascular:  Negative for chest pain and palpitations.  Gastrointestinal:  Negative for abdominal pain, constipation, diarrhea, nausea and vomiting.  Genitourinary:  Negative for dysuria and frequency.  Musculoskeletal:  Positive for arthralgias. Negative for back pain, joint swelling and neck pain.  Skin:  Negative for rash.   Neurological: Negative.  Negative for tremors and numbness.  Hematological:  Negative for adenopathy. Does not bruise/bleed easily.  Psychiatric/Behavioral:  Negative for behavioral problems (Depression), sleep disturbance and suicidal ideas. The patient is not nervous/anxious.     Vital Signs: BP 130/60   Pulse 100   Temp 98.4 F (36.9 C)   Resp 16   Ht 6\' 1"  (1.854 m)   Wt (!) 330 lb 3.2 oz (149.8 kg)   SpO2 95%   BMI 43.56 kg/m    Physical Exam Vitals and nursing note reviewed.  Constitutional:      General: He is not in acute distress.    Appearance: He is well-developed. He is obese. He is not diaphoretic.  HENT:     Head: Normocephalic and atraumatic.     Mouth/Throat:     Pharynx: No oropharyngeal exudate.  Eyes:     Pupils: Pupils are equal, round, and reactive to light.  Neck:     Thyroid: No thyromegaly.     Vascular: No JVD.     Trachea: No tracheal deviation.  Cardiovascular:     Rate and Rhythm: Normal rate and regular rhythm.     Heart sounds: Normal heart sounds. No murmur heard.    No friction rub. No gallop.  Pulmonary:     Effort: Pulmonary effort is normal. No respiratory distress.     Breath sounds: No wheezing or rales.  Chest:     Chest wall: No tenderness.  Abdominal:     General: Bowel sounds are normal.     Palpations: Abdomen is soft.     Tenderness: There is no abdominal tenderness.  Musculoskeletal:        General: Tenderness present.     Cervical back: Normal range of motion and neck supple.     Comments: tender to palpation over tendon along left arm/elbow and worse with gripping, consistent with tendonitis, has full ROM  Lymphadenopathy:     Cervical: No cervical adenopathy.  Skin:    General: Skin is warm and dry.  Neurological:     Mental Status: He is alert and oriented to person, place, and time.     Cranial Nerves: No cranial nerve deficit.  Psychiatric:        Behavior: Behavior normal.        Thought Content: Thought  content normal.        Judgment: Judgment normal.        Assessment/Plan: 1. Lateral epicondylitis of left elbow Will rest, ice, may use anti-inflammatory, and may try brace  2. Morbid obesity with BMI of 40.0-44.9, adult [facility]) May restart phentermine, discussed also working on diet and exercise and keeping consistency with routine - phentermine (ADIPEX-P) 37.5 MG tablet; Take 1 tablet (37.5 mg total) by mouth daily before breakfast.  Dispense: 30 tablet; Refill: 0  3. Mixed hyperlipidemia -  Lipid Panel With LDL/HDL Ratio  4. Thyroid disorder screening - TSH + free T4  5. Other fatigue - TSH + free T4 - Comprehensive metabolic panel - Lipid Panel With LDL/HDL Ratio - CBC w/Diff/Platelet  6. Erectile dysfunction, unspecified erectile dysfunction type - sildenafil (VIAGRA) 100 MG tablet; Take 1 tablet 1 hour prior to intercourse  Dispense: 30 tablet; Refill: 0   General Counseling: kash mclinn understanding of the findings of todays visit and agrees with plan of treatment. I have discussed any further diagnostic evaluation that may be needed or ordered today. We also reviewed his medications today. he has been encouraged to call the office with any questions or concerns that should arise related to todays visit.    Orders Placed This Encounter  Procedures   TSH + free T4   Comprehensive metabolic panel   Lipid Panel With LDL/HDL Ratio   CBC w/Diff/Platelet    Meds ordered this encounter  Medications   phentermine (ADIPEX-P) 37.5 MG tablet    Sig: Take 1 tablet (37.5 mg total) by mouth daily before breakfast.    Dispense:  30 tablet    Refill:  0    Run through insurance or goodrx   sildenafil (VIAGRA) 100 MG tablet    Sig: Take 1 tablet 1 hour prior to intercourse    Dispense:  30 tablet    Refill:  0    This patient was seen by Lynn Ito, PA-C in collaboration with Dr. Beverely Risen as a part of collaborative care agreement.   Total time spent:30  Minutes Time spent includes review of chart, medications, test results, and follow up plan with the patient.      Dr Lyndon Code Internal medicine

## 2023-08-06 ENCOUNTER — Other Ambulatory Visit: Payer: Self-pay | Admitting: Physician Assistant

## 2023-08-06 DIAGNOSIS — L739 Follicular disorder, unspecified: Secondary | ICD-10-CM

## 2023-08-20 ENCOUNTER — Encounter: Payer: Self-pay | Admitting: Physician Assistant

## 2023-08-20 ENCOUNTER — Ambulatory Visit: Payer: BC Managed Care – PPO | Admitting: Physician Assistant

## 2023-08-20 VITALS — BP 159/96 | HR 96

## 2023-08-20 DIAGNOSIS — E291 Testicular hypofunction: Secondary | ICD-10-CM | POA: Diagnosis not present

## 2023-08-20 DIAGNOSIS — N5201 Erectile dysfunction due to arterial insufficiency: Secondary | ICD-10-CM

## 2023-08-20 NOTE — Progress Notes (Signed)
08/20/2023 3:16 PM   Daniel Cummings 10/30/1970 [id number]  CC: Chief Complaint  Patient presents with   Follow-up   HPI: Daniel Cummings is a 52 y.o. male with PMH hypogonadism previously on TRT and ED on sildenafil who presents today to discuss resuming TRT.   Today he reports he stopped IM testosterone supply and ate about 6 to 7 months ago for no particular reason.  He denies any significant health events in the interim.  He has noticed decreased libido, fatigue, and difficulty with weight loss.  He prefers to resume IM TRT with his wife performing his shots at home.  She is still comfortable with how to do this.  Sildenafil 100 mg as needed is working for him, however he feels it works a little bit better when he has TRT on board as well.  PMH: Past Medical History:  Diagnosis Date   Hypertension     Surgical History: Past Surgical History:  Procedure Laterality Date   INGUINAL HERNIA REPAIR Left 1995    Home Medications:  Allergies as of 08/20/2023   No Known Allergies      Medication List        Accurate as of August 20, 2023  3:16 PM. If you have any questions, ask your nurse or doctor.          BD SafetyGlide Needle 18G X 1-1/2" Misc Generic drug: NEEDLE (DISP) 18 G Draw medication up with   doxycycline 100 MG tablet Commonly known as: VIBRA-TABS TAKE 1 TABLET BY MOUTH TWICE A DAY   hydrochlorothiazide 25 MG tablet Commonly known as: HYDRODIURIL TAKE 1 TABLET (25 MG TOTAL) BY MOUTH DAILY.   phentermine 37.5 MG tablet Commonly known as: ADIPEX-P Take 1 tablet (37.5 mg total) by mouth daily before breakfast.   sildenafil 100 MG tablet Commonly known as: VIAGRA Take 1 tablet 1 hour prior to intercourse   testosterone cypionate 200 MG/ML injection Commonly known as: DEPOTESTOSTERONE CYPIONATE INJECT 1 ML (200 MG TOTAL) INTO THE MUSCLE EVERY 14 DAYS   verapamil 240 MG CR tablet Commonly known as: CALAN-SR Take 1 tablet (240 mg total)  by mouth at bedtime.        Allergies:  No Known Allergies  Family History: Family History  Problem Relation Age of Onset   Coronary artery disease Neg Hx    Diabetes Neg Hx    Hypertension Neg Hx     Social History:   reports that he has never smoked. He has never used smokeless tobacco. He reports that he does not drink alcohol and does not use drugs.  Physical Exam: BP (!) 159/96   Pulse 96   Constitutional:  Alert and oriented, no acute distress, nontoxic appearing HEENT: [place], AT Cardiovascular: No clubbing, cyanosis, or edema Respiratory: Normal respiratory effort, no increased work of breathing Skin: No rashes, bruises or suspicious lesions Neurologic: Grossly intact, no focal deficits, moving all 4 extremities Psychiatric: Normal mood and affect  Assessment & Plan:   1. Hypogonadism in male Will recheck a testosterone today, anticipated will be falsely low given p.m. draw.  Anticipate resuming IM testosterone cypionate 200 mg every 14 days based on results.  He recalls the need for serial blood testing to check his levels on TRT. - Testosterone  2. Erectile dysfunction due to arterial insufficiency Continue sildenafil.  Return for Will call with results.  Daniel Ching, PA-C  [facility] [place] [address] [place], [place] [address] [phone]  [phone]    

## 2023-08-21 LAB — LIPID PANEL WITH LDL/HDL RATIO
Cholesterol, Total: 149 mg/dL (ref 100–199)
HDL: 42 mg/dL (ref >39)
LDL Chol Calc (NIH): 78 mg/dL (ref 0–99)
LDL/HDL Ratio: 1.9 {ratio} (ref 0.0–3.6)
Triglycerides: 170 mg/dL — ABNORMAL HIGH (ref 0–149)
VLDL Cholesterol Cal: 29 mg/dL (ref 5–40)

## 2023-08-21 LAB — CBC WITH DIFFERENTIAL/PLATELET
Basophils Absolute: 0.1 10*3/uL (ref 0.0–0.2)
Basos: 1 %
EOS (ABSOLUTE): 0.5 10*3/uL — ABNORMAL HIGH (ref 0.0–0.4)
Eos: 7 %
Hematocrit: 39.8 % (ref 37.5–51.0)
Hemoglobin: 13 g/dL (ref 13.0–17.7)
Immature Grans (Abs): 0 10*3/uL (ref 0.0–0.1)
Immature Granulocytes: 0 %
Lymphocytes Absolute: 2.1 10*3/uL (ref 0.7–3.1)
Lymphs: 29 %
MCH: 27.9 pg (ref 26.6–33.0)
MCHC: 32.7 g/dL (ref 31.5–35.7)
MCV: 85 fL (ref 79–97)
Monocytes Absolute: 0.8 10*3/uL (ref 0.1–0.9)
Monocytes: 11 %
Neutrophils Absolute: 3.7 10*3/uL (ref 1.4–7.0)
Neutrophils: 52 %
Platelets: 294 10*3/uL (ref 150–450)
RBC: 4.66 x10E6/uL (ref 4.14–5.80)
RDW: 13.3 % (ref 11.6–15.4)
WBC: 7.1 10*3/uL (ref 3.4–10.8)

## 2023-08-21 LAB — TSH+FREE T4
Free T4: 1.14 ng/dL (ref 0.82–1.77)
TSH: 1.63 u[IU]/mL (ref 0.450–4.500)

## 2023-08-21 LAB — COMPREHENSIVE METABOLIC PANEL
ALT: 29 [IU]/L (ref 0–44)
AST: 34 [IU]/L (ref 0–40)
Albumin: 4 g/dL (ref 3.8–4.9)
Alkaline Phosphatase: 118 [IU]/L (ref 44–121)
BUN/Creatinine Ratio: 12 (ref 9–20)
BUN: 16 mg/dL (ref 6–24)
Bilirubin Total: 0.6 mg/dL (ref 0.0–1.2)
CO2: 23 mmol/L (ref 20–29)
Calcium: 9.1 mg/dL (ref 8.7–10.2)
Chloride: 105 mmol/L (ref 96–106)
Creatinine, Ser: 1.33 mg/dL — ABNORMAL HIGH (ref 0.76–1.27)
Globulin, Total: 3 g/dL (ref 1.5–4.5)
Glucose: 96 mg/dL (ref 70–99)
Potassium: 4.4 mmol/L (ref 3.5–5.2)
Sodium: 143 mmol/L (ref 134–144)
Total Protein: 7 g/dL (ref 6.0–8.5)
eGFR: 64 mL/min/{1.73_m2} (ref >59)

## 2023-08-21 LAB — TESTOSTERONE: Testosterone: 191 ng/dL — ABNORMAL LOW (ref 264–916)

## 2023-08-25 ENCOUNTER — Telehealth: Payer: Self-pay | Admitting: *Deleted

## 2023-08-25 NOTE — Telephone Encounter (Signed)
Pt calling asking for the results of his testosterone and would like rx called to pharmacy

## 2023-08-26 MED ORDER — BD ECLIPSE NEEDLE 21G X 1-1/2" MISC: 1 refills | Status: AC

## 2023-08-26 MED ORDER — BD SAFETYGLIDE NEEDLE 18G X 1-1/2" MISC: 0 refills | Status: AC

## 2023-08-26 MED ORDER — TESTOSTERONE CYPIONATE 200 MG/ML IM SOLN: 200.0000 mg | INTRAMUSCULAR | 1 refills | Status: DC

## 2023-08-26 MED ORDER — SYRINGE 2-3 ML 3 ML MISC: 2 refills | Status: AC

## 2023-08-26 NOTE — Telephone Encounter (Signed)
Pt calls triage line and states that he is calling for update on status of testosterone prescription.

## 2023-08-26 NOTE — Addendum Note (Signed)
Addended by: Debarah Crape on: 08/26/2023 05:34 PM   Modules accepted: Orders

## 2023-08-26 NOTE — Telephone Encounter (Signed)
Responded via MyChart.

## 2023-08-29 ENCOUNTER — Ambulatory Visit (INDEPENDENT_AMBULATORY_CARE_PROVIDER_SITE_OTHER): Payer: BC Managed Care – PPO | Admitting: Physician Assistant

## 2023-08-29 ENCOUNTER — Encounter: Payer: Self-pay | Admitting: Physician Assistant

## 2023-08-29 VITALS — BP 130/95 | HR 105 | Temp 98.4°F | Resp 16 | Ht 73.0 in | Wt 328.0 lb

## 2023-08-29 DIAGNOSIS — Z6841 Body Mass Index (BMI) 40.0 and over, adult: Secondary | ICD-10-CM

## 2023-08-29 DIAGNOSIS — L739 Follicular disorder, unspecified: Secondary | ICD-10-CM

## 2023-08-29 DIAGNOSIS — R7989 Other specified abnormal findings of blood chemistry: Secondary | ICD-10-CM

## 2023-08-29 DIAGNOSIS — I10 Essential (primary) hypertension: Secondary | ICD-10-CM

## 2023-08-29 MED ORDER — PHENTERMINE HCL 37.5 MG PO TABS
37.5000 mg | ORAL_TABLET | Freq: Every day | ORAL | 0 refills | Status: DC
Start: 2023-08-29 — End: 2024-02-27

## 2023-08-29 MED ORDER — DOXYCYCLINE HYCLATE 100 MG PO TABS: 100.0000 mg | ORAL_TABLET | Freq: Two times a day (BID) | ORAL | 0 refills | Status: AC

## 2023-08-29 NOTE — Progress Notes (Cosign Needed)
[facility] [address] 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 Will give note for more supportive/comfortable shoes as long as shoe doesn't interfere with work functions -BP 130/90, took both meds last night. Did just work out before visit. Will monitor and knows to hold phentermine if elevated. Would like to continue phentermine while looking into GLP1--he will call insurance about zepbound/wegovy option and let us know which to send if covered -will also work on diet and reducing caloric intake, estimates eating 4000cal per day currently  Current Medication: Outpatient Encounter Medications as of 08/29/2023  Medication Sig   hydrochlorothiazide (HYDRODIURIL) 25 MG tablet TAKE 1 TABLET (25 MG TOTAL) BY MOUTH DAILY.   NEEDLE, DISP, 18 G (BD SAFETYGLIDE NEEDLE) 18G X 1-1/2" MISC Draw medication up with   NEEDLE, DISP, 21 G (BD ECLIPSE NEEDLE) 21G X 1-1/2" MISC Use one needle every 2 weeks to inject testosterone into the muscle.   sildenafil (VIAGRA) 100 MG tablet Take 1 tablet 1 hour prior to intercourse   Syringe, Disposable, (2-3CC SYRINGE) 3 ML MISC Use one syringe every 2 weeks with testosterone injection.   testosterone cypionate (DEPOTESTOSTERONE CYPIONATE) 200 MG/ML injection Inject 1 mL (200 mg total) into the muscle every 14 (fourteen) days.   verapamil (CALAN-SR) 240 MG CR tablet Take 1 tablet (240 mg total) by mouth at bedtime.   [DISCONTINUED] doxycycline (VIBRA-TABS) 100 MG  tablet TAKE 1 TABLET BY MOUTH TWICE A DAY   [DISCONTINUED] phentermine (ADIPEX-P) 37.5 MG tablet Take 1 tablet (37.5 mg total) by mouth daily before breakfast.   doxycycline (VIBRA-TABS) 100 MG tablet Take 1 tablet (100 mg total) by mouth 2 (two) times daily.   phentermine (ADIPEX-P) 37.5 MG tablet Take 1 tablet (37.5 mg total) by mouth daily before breakfast.   No facility-administered encounter medications on file as of 08/29/2023.    Surgical History: Past Surgical History:  Procedure Laterality Date   INGUINAL HERNIA REPAIR Left 1995    Medical History: Past Medical History:  Diagnosis Date   Hypertension     Family History: Family History  Problem Relation Age of Onset   Coronary artery disease Neg Hx    Diabetes Neg Hx    Hypertension Neg Hx     Social History   Socioeconomic History   Marital status: Married    Spouse name: Not on file   Number of children: Not on file   Years of education: Not on file   Highest education level: Not on file  Occupational History   Not on file  Tobacco Use   Smoking status: Never   Smokeless tobacco: Never  Vaping Use   Vaping status: Never Used  Substance and Sexual Activity   Alcohol use: No   Drug use: No   Sexual activity: Not on file  Other Topics Concern   Not on file  Social History Narrative   Not on file   Social Drivers of Health   Financial Resource Strain:  Not on file  Food Insecurity: Not on file  Transportation Needs: Not on file  Physical Activity: Not on file  Stress: Not on file  Social Connections: Not on file  Intimate Partner Violence: Not on file      Review of Systems  Constitutional:  Negative for chills, fatigue and unexpected weight change.  HENT:  Negative for congestion, rhinorrhea, sneezing and sore throat.   Eyes:  Negative for redness.  Respiratory:  Negative for cough, chest tightness and shortness of breath.   Cardiovascular:  Negative for chest pain and palpitations.   Gastrointestinal:  Negative for abdominal pain, constipation, diarrhea, nausea and vomiting.  Genitourinary:  Negative for dysuria and frequency.  Musculoskeletal:  Positive for arthralgias. Negative for back pain, joint swelling and neck pain.  Skin:  Negative for rash.  Neurological: Negative.  Negative for tremors and numbness.  Hematological:  Negative for adenopathy. Does not bruise/bleed easily.  Psychiatric/Behavioral:  Negative for behavioral problems (Depression), sleep disturbance and suicidal ideas. The patient is not nervous/anxious.     Vital Signs: BP (!) 130/95   Pulse (!) 105   Temp 98.4 F (36.9 C)   Resp 16   Ht 6\' 1"  (1.854 m)   Wt (!) 328 lb (148.8 kg)   SpO2 95%   BMI 43.27 kg/m    Physical Exam Vitals and nursing note reviewed.  Constitutional:      General: He is not in acute distress.    Appearance: He is well-developed. He is obese. He is not diaphoretic.  HENT:     Head: Normocephalic and atraumatic.     Mouth/Throat:     Pharynx: No oropharyngeal exudate.  Eyes:     Pupils: Pupils are equal, round, and reactive to light.  Neck:     Thyroid: No thyromegaly.     Vascular: No JVD.     Trachea: No tracheal deviation.  Cardiovascular:     Rate and Rhythm: Normal rate and regular rhythm.     Heart sounds: Normal heart sounds. No murmur heard.    No friction rub. No gallop.  Pulmonary:     Effort: Pulmonary effort is normal.  Abdominal:     General: Bowel sounds are normal.     Palpations: Abdomen is soft.  Musculoskeletal:        General: Normal range of motion.     Cervical back: Normal range of motion and neck supple.  Lymphadenopathy:     Cervical: No cervical adenopathy.  Skin:    General: Skin is warm and dry.  Neurological:     Mental Status: He is alert and oriented to person, place, and time.  Psychiatric:        Behavior: Behavior normal.        Thought Content: Thought content normal.        Judgment: Judgment normal.         Assessment/Plan: 1. Essential hypertension Borderline in office--just left gym, will monitor and hold phentermine if elevated  2. Elevated serum creatinine (Primary) Will recheck labs, advised to stay hydrated - Basic Metabolic Panel (BMET)  3. Morbid obesity with BMI of 40.0-44.9, adult (HCC) Down 2lbs since last visit. May continue on phentermine as along as BP stable, will also work on diet and exercise. Pt may call insurance about GLP1 for wt loss and may call if script desired - phentermine (ADIPEX-P) 37.5 MG tablet; Take 1 tablet (37.5 mg total) by mouth daily before breakfast.  Dispense: 30 tablet; Refill:  0 Obesity Counseling: Had a lengthy discussion regarding patients BMI and weight issues. Patient was instructed on portion control as well as increased activity. Also discussed caloric restrictions with trying to maintain intake less than 2000 Kcal. Discussions were made in accordance with the 5As of weight management. Simple actions such as not eating late and if able to, taking a walk is suggested.  4. Folliculitis - doxycycline (VIBRA-TABS) 100 MG tablet; Take 1 tablet (100 mg total) by mouth 2 (two) times daily.  Dispense: 14 tablet; Refill: 0   General Counseling: jionni sanders understanding of the findings of todays visit and agrees with plan of treatment. I have discussed any further diagnostic evaluation that may be needed or ordered today. We also reviewed his medications today. he has been encouraged to call the office with any questions or concerns that should arise related to todays visit.    Orders Placed This Encounter  Procedures   Basic Metabolic Panel (BMET)    Meds ordered this encounter  Medications   phentermine (ADIPEX-P) 37.5 MG tablet    Sig: Take 1 tablet (37.5 mg total) by mouth daily before breakfast.    Dispense:  30 tablet    Refill:  0    Run through insurance or goodrx   doxycycline (VIBRA-TABS) 100 MG tablet    Sig: Take 1  tablet (100 mg total) by mouth 2 (two) times daily.    Dispense:  14 tablet    Refill:  0    DX Code Needed  .    This patient was seen by Lynn Ito, PA-C in collaboration with Dr. Beverely Risen as a part of collaborative care agreement.   Total time spent:30 Minutes Time spent includes review of chart, medications, test results, and follow up plan with the patient.      Dr Lyndon Code Internal medicine

## 2023-10-03 ENCOUNTER — Other Ambulatory Visit: Payer: Self-pay | Admitting: *Deleted

## 2023-10-03 DIAGNOSIS — R972 Elevated prostate specific antigen [PSA]: Secondary | ICD-10-CM

## 2023-10-03 DIAGNOSIS — E291 Testicular hypofunction: Secondary | ICD-10-CM

## 2023-10-13 ENCOUNTER — Other Ambulatory Visit: Payer: Self-pay

## 2023-10-14 ENCOUNTER — Other Ambulatory Visit: Payer: Self-pay

## 2023-10-14 DIAGNOSIS — R972 Elevated prostate specific antigen [PSA]: Secondary | ICD-10-CM

## 2023-10-14 DIAGNOSIS — E291 Testicular hypofunction: Secondary | ICD-10-CM

## 2023-10-15 ENCOUNTER — Other Ambulatory Visit: Payer: Self-pay | Admitting: Urology

## 2023-10-15 DIAGNOSIS — N529 Male erectile dysfunction, unspecified: Secondary | ICD-10-CM

## 2023-10-15 LAB — HEMOGLOBIN AND HEMATOCRIT, BLOOD
Hematocrit: 47.4 % (ref 37.5–51.0)
Hemoglobin: 15.2 g/dL (ref 13.0–17.7)

## 2023-10-15 LAB — TESTOSTERONE: Testosterone: 1048 ng/dL — ABNORMAL HIGH (ref 264–916)

## 2023-10-15 LAB — PSA: Prostate Specific Ag, Serum: 0.8 ng/mL (ref 0.0–4.0)

## 2023-10-15 MED ORDER — SILDENAFIL CITRATE 100 MG PO TABS: ORAL_TABLET | ORAL | 3 refills | Status: AC

## 2023-10-17 ENCOUNTER — Ambulatory Visit: Payer: BC Managed Care – PPO | Admitting: Physician Assistant

## 2023-10-31 ENCOUNTER — Other Ambulatory Visit: Payer: Self-pay | Admitting: Nurse Practitioner

## 2023-10-31 DIAGNOSIS — I10 Essential (primary) hypertension: Secondary | ICD-10-CM

## 2023-11-25 ENCOUNTER — Encounter: Payer: Self-pay | Admitting: Nurse Practitioner

## 2023-11-25 ENCOUNTER — Ambulatory Visit: Admitting: Nurse Practitioner

## 2023-11-25 ENCOUNTER — Telehealth: Payer: Self-pay | Admitting: Physician Assistant

## 2023-11-25 VITALS — BP 150/108 | HR 94 | Temp 98.1°F | Resp 16 | Ht 73.0 in | Wt 342.0 lb

## 2023-11-25 DIAGNOSIS — R7301 Impaired fasting glucose: Secondary | ICD-10-CM

## 2023-11-25 DIAGNOSIS — I10 Essential (primary) hypertension: Secondary | ICD-10-CM

## 2023-11-25 LAB — POCT CBG (FASTING - GLUCOSE)-MANUAL ENTRY: Glucose Fasting, POC: 123 mg/dL — AB (ref 70–99)

## 2023-11-25 MED ORDER — VERAPAMIL HCL ER 300 MG PO CP24: 1.0000 | ORAL_CAPSULE | Freq: Every day | ORAL | 2 refills | Status: DC

## 2023-11-25 NOTE — Progress Notes (Signed)
 [facility] [address] [place], [place] [address]  Internal MEDICINE  Office Visit Note  Patient Name: Daniel Cummings  [id number]  [id number]  Date of Service: 11/25/2023  Chief Complaint  Patient presents with   Acute Visit    Blood pressure high      HPI Daniel Cummings presents for an acute sick visit for elevated BP with headaches. Feeling tired and sluggish.  Glucose checked to rule out low blood glucose -- this was ok BP still elevated today, may need to adjust BP medication.     Current Medication:  Outpatient Encounter Medications as of 11/25/2023  Medication Sig   doxycycline  (VIBRA -TABS) 100 MG tablet Take 1 tablet (100 mg total) by mouth 2 (two) times daily.   hydrochlorothiazide  (HYDRODIURIL ) 25 MG tablet TAKE 1 TABLET (25 MG TOTAL) BY MOUTH DAILY.   NEEDLE, DISP, 18 G (BD SAFETYGLIDE NEEDLE) 18G X 1-1/2" MISC Draw medication up with   NEEDLE, DISP, 21 G (BD ECLIPSE NEEDLE) 21G X 1-1/2" MISC Use one needle every 2 weeks to inject testosterone  into the muscle.   phentermine  (ADIPEX-P ) 37Cummings5 MG tablet Take 1 tablet (37Cummings5 mg total) by mouth daily before breakfast.   sildenafil  (VIAGRA ) 100 MG tablet Take 1 tablet 1 hour prior to intercourse   Syringe, Disposable, (2-3CC SYRINGE) 3 ML MISC Use one syringe every 2 weeks with testosterone  injection.   testosterone  cypionate (DEPOTESTOSTERONE CYPIONATE) 200 MG/ML injection Inject 1 mL (200 mg total) into the muscle every 14 (fourteen) days.   Cummings  HCl CR 300 MG CP24 Take 1 capsule (300 mg total) by mouth daily.   [DISCONTINUED] Cummings  (CALAN -SR) 240 MG CR tablet TAKE 1 TABLET BY MOUTH AT BEDTIME.   No facility-administered encounter medications on file as of 11/25/2023.      Medical History: Past Medical History:  Diagnosis Date   Hypertension      Vital Signs: BP (!) 150/108   Pulse 94   Temp 98Cummings1 F (36Cummings7 C)   Resp 16   Ht 6\' 1"  (1Cummings854 m)   Wt (!) 342 lb (155Cummings1 kg)   SpO2 96%   BMI  45Cummings12 kg/m    Review of Systems  Constitutional:  Positive for fatigue. Negative for chills and unexpected weight change.  HENT:  Negative for congestion, rhinorrhea, sneezing and sore throat.   Eyes:  Negative for redness.  Respiratory:  Negative for cough, chest tightness and shortness of breath.   Cardiovascular:  Negative for chest pain and palpitations.  Gastrointestinal:  Negative for abdominal pain, constipation, diarrhea, nausea and vomiting.  Genitourinary:  Negative for dysuria and frequency.  Musculoskeletal:  Positive for arthralgias. Negative for back pain, joint swelling and neck pain.  Skin:  Negative for rash.  Neurological: Negative.  Negative for tremors and numbness.  Hematological:  Negative for adenopathy. Does not bruise/bleed easily.  Psychiatric/Behavioral:  Negative for behavioral problems (Depression), sleep disturbance and suicidal ideas. The patient is not nervous/anxious.     Physical Exam Vitals reviewed.  Constitutional:      General: He is not in acute distress.    Appearance: Normal appearance. He is obese. He is not ill-appearing.  HENT:     Head: Normocephalic and atraumatic.  Eyes:     Pupils: Pupils are equal, round, and reactive to light.  Cardiovascular:     Rate and Rhythm: Normal rate and regular rhythm.  Pulmonary:     Effort: Pulmonary effort is normal. No respiratory distress.  Neurological:  Mental Status: He is alert and oriented to person, place, and time.  Psychiatric:        Mood and Affect: Mood normal.        Behavior: Behavior normal.       Assessment/Plan: 1. Essential hypertension (Primary) Cummings  dose increased, follow up in 4 weeks with Daniel Cummings - Cummings  HCl CR 300 MG CP24; Take 1 capsule (300 mg total) by mouth daily.  Dispense: 30 capsule; Refill: 2  2. Elevated fasting glucose Glucose is ok - POCT CBG (Fasting - Glucose)   General Counseling: Daniel Cummings understanding of the findings of  todays visit and agrees with plan of treatment. I have discussed any further diagnostic evaluation that may be needed or ordered today. We also reviewed his medications today. he has been encouraged to call the office with any questions or concerns that should arise related to todays visit.    Counseling:    Orders Placed This Encounter  Procedures   POCT CBG (Fasting - Glucose)    Meds ordered this encounter  Medications   Cummings  HCl CR 300 MG CP24    Sig: Take 1 capsule (300 mg total) by mouth daily.    Dispense:  30 capsule    Refill:  2    Fill new script today, discontinue 240 mg dose    Return in about 4 weeks (around 12/23/2023) for F/U, BP check, Daniel Cummings . .  [place] Controlled Substance Database was reviewed by me for overdose risk score (ORS)  Time spent:20 Minutes Time spent with patient included reviewing progress notes, labs, imaging studies, and discussing plan for follow up.   This patient was seen by Daniel Pons, FNP-C in collaboration with Dr. Verneta Gone as a part of collaborative care agreement.  Chapman Matteucci R. Bobbi Burow, MSN, FNP-C Internal Medicine

## 2023-11-25 NOTE — Telephone Encounter (Signed)
 Attempted to contact patient to schedule today's follow up appointment. No answer. Vm not set up-Tonn

## 2023-12-21 ENCOUNTER — Other Ambulatory Visit: Payer: Self-pay | Admitting: Physician Assistant

## 2023-12-21 DIAGNOSIS — E291 Testicular hypofunction: Secondary | ICD-10-CM

## 2023-12-24 NOTE — Telephone Encounter (Signed)
 Pt states he will call us  back to schedule a testosterone lab visit, last injection was one month ago.

## 2023-12-25 ENCOUNTER — Ambulatory Visit: Admitting: Physician Assistant

## 2023-12-29 ENCOUNTER — Other Ambulatory Visit: Payer: Self-pay

## 2024-01-01 ENCOUNTER — Encounter: Payer: Self-pay | Admitting: Physician Assistant

## 2024-01-14 ENCOUNTER — Other Ambulatory Visit: Payer: Self-pay | Admitting: Nurse Practitioner

## 2024-01-14 DIAGNOSIS — I10 Essential (primary) hypertension: Secondary | ICD-10-CM

## 2024-02-18 ENCOUNTER — Other Ambulatory Visit: Payer: Self-pay | Admitting: Urology

## 2024-02-18 DIAGNOSIS — N529 Male erectile dysfunction, unspecified: Secondary | ICD-10-CM

## 2024-02-19 ENCOUNTER — Telehealth: Payer: Self-pay | Admitting: Physician Assistant

## 2024-02-19 NOTE — Telephone Encounter (Signed)
 No vm, sent mychart message to confirm 02/26/24 appointment-Toni

## 2024-02-20 ENCOUNTER — Other Ambulatory Visit: Payer: Self-pay | Admitting: Physician Assistant

## 2024-02-20 DIAGNOSIS — I10 Essential (primary) hypertension: Secondary | ICD-10-CM

## 2024-02-26 ENCOUNTER — Encounter: Payer: BC Managed Care – PPO | Admitting: Physician Assistant

## 2024-02-27 ENCOUNTER — Encounter: Payer: Self-pay | Admitting: Physician Assistant

## 2024-02-27 ENCOUNTER — Ambulatory Visit (INDEPENDENT_AMBULATORY_CARE_PROVIDER_SITE_OTHER): Admitting: Physician Assistant

## 2024-02-27 VITALS — BP 140/102 | HR 105 | Temp 97.8°F | Resp 16 | Ht 73.0 in | Wt 355.2 lb

## 2024-02-27 DIAGNOSIS — I10 Essential (primary) hypertension: Secondary | ICD-10-CM | POA: Diagnosis not present

## 2024-02-27 DIAGNOSIS — R7989 Other specified abnormal findings of blood chemistry: Secondary | ICD-10-CM | POA: Diagnosis not present

## 2024-02-27 DIAGNOSIS — Z6841 Body Mass Index (BMI) 40.0 and over, adult: Secondary | ICD-10-CM

## 2024-02-27 DIAGNOSIS — R7301 Impaired fasting glucose: Secondary | ICD-10-CM

## 2024-02-27 DIAGNOSIS — Z0001 Encounter for general adult medical examination with abnormal findings: Secondary | ICD-10-CM

## 2024-02-27 DIAGNOSIS — Z1212 Encounter for screening for malignant neoplasm of rectum: Secondary | ICD-10-CM

## 2024-02-27 DIAGNOSIS — R7303 Prediabetes: Secondary | ICD-10-CM | POA: Diagnosis not present

## 2024-02-27 DIAGNOSIS — Z1211 Encounter for screening for malignant neoplasm of colon: Secondary | ICD-10-CM

## 2024-02-27 LAB — GLUCOSE, POCT (MANUAL RESULT ENTRY): POC Glucose: 175 mg/dL — AB (ref 70–99)

## 2024-02-27 MED ORDER — HYDROCHLOROTHIAZIDE 25 MG PO TABS: 25.0000 mg | ORAL_TABLET | Freq: Every day | ORAL | 0 refills | Status: AC

## 2024-02-27 MED ORDER — BUPROPION HCL ER (XL) 150 MG PO TB24: 150.0000 mg | ORAL_TABLET | Freq: Every day | ORAL | 2 refills | Status: AC

## 2024-02-27 NOTE — Progress Notes (Signed)
 [facility] [address] [place], [place] [address]  Internal MEDICINE  Office Visit Note  Patient Name: Daniel Cummings  [id number]  [id number]  Date of Service: 02/27/2024  Chief Complaint  Patient presents with   Annual Exam   Hypertension   Quality Metric Gaps    Colonoscopy and Shingles vaccines   Weight Loss     HPI Pt is here for routine health maintenance examination -BP at home 140/80s typically and will monitor. May need to raise verapamil  further -148/100 on recheck -he is really interested in wt loss and would like referral to bariatric surgery. Open to wellbutrin  off label use for wt loss. May consider GLP1 again in future, but coverage may be a challenge--could try with prediabetes -still due for colonoscopy and states he is ready now. Will send new referral -need creatinine rechecked still--reordered  Current Medication: Outpatient Encounter Medications as of 02/27/2024  Medication Sig   buPROPion  (WELLBUTRIN  XL) 150 MG 24 hr tablet Take 1 tablet (150 mg total) by mouth daily.   doxycycline  (VIBRA -TABS) 100 MG tablet Take 1 tablet (100 mg total) by mouth 2 (two) times daily.   NEEDLE, DISP, 18 G (BD SAFETYGLIDE NEEDLE) 18G X 1-1/2 MISC Draw medication up with   NEEDLE, DISP, 21 G (BD ECLIPSE NEEDLE) 21G X 1-1/2 MISC Use one needle every 2 weeks to inject testosterone  into the muscle.   sildenafil  (VIAGRA ) 100 MG tablet Take 1 tablet 1 hour prior to intercourse   Syringe, Disposable, (2-3CC SYRINGE) 3 ML MISC Use one syringe every 2 weeks with testosterone  injection.   testosterone  cypionate (DEPOTESTOSTERONE CYPIONATE) 200 MG/ML injection INJECT 1 ML (200 MG TOTAL) INTO THE MUSCLE EVERY 14 DAYS   Verapamil  HCl CR 300 MG CP24 TAKE 1 CAPSULE BY MOUTH EVERY DAY   [DISCONTINUED] hydrochlorothiazide  (HYDRODIURIL ) 25 MG tablet TAKE 1 TABLET (25 MG TOTAL) BY MOUTH DAILY.   [DISCONTINUED] phentermine  (ADIPEX-P ) 37.5 MG tablet Take 1 tablet (37.5 mg  total) by mouth daily before breakfast.   hydrochlorothiazide  (HYDRODIURIL ) 25 MG tablet Take 1 tablet (25 mg total) by mouth daily.   No facility-administered encounter medications on file as of 02/27/2024.    Surgical History: Past Surgical History:  Procedure Laterality Date   INGUINAL HERNIA REPAIR Left 1995    Medical History: Past Medical History:  Diagnosis Date   Hypertension     Family History: Family History  Problem Relation Age of Onset   Coronary artery disease Neg Hx    Diabetes Neg Hx    Hypertension Neg Hx       Review of Systems  Constitutional:  Negative for chills and unexpected weight change.  HENT:  Negative for congestion, rhinorrhea, sneezing and sore throat.   Eyes:  Negative for redness.  Respiratory:  Negative for cough, chest tightness and shortness of breath.   Cardiovascular:  Negative for chest pain and palpitations.  Gastrointestinal:  Negative for abdominal pain, constipation, diarrhea, nausea and vomiting.  Genitourinary:  Negative for dysuria and frequency.  Musculoskeletal:  Positive for arthralgias. Negative for back pain, joint swelling and neck pain.  Skin:  Negative for rash.  Neurological: Negative.  Negative for tremors and numbness.  Hematological:  Negative for adenopathy. Does not bruise/bleed easily.  Psychiatric/Behavioral:  Negative for behavioral problems (Depression), sleep disturbance and suicidal ideas. The patient is not nervous/anxious.      Vital Signs: BP (!) 140/102   Pulse (!) 105   Temp 97.8 F (36.6 C)  Resp 16   Ht 6' 1 (1.854 m)   Wt (!) 355 lb 3.2 oz (161.1 kg)   SpO2 94%   BMI 46.86 kg/m    Physical Exam Vitals reviewed.  Constitutional:      General: He is not in acute distress.    Appearance: Normal appearance. He is obese. He is not ill-appearing.  HENT:     Head: Normocephalic and atraumatic.     Mouth/Throat:     Pharynx: No posterior oropharyngeal erythema.   Eyes:     Pupils:  Pupils are equal, round, and reactive to light.    Cardiovascular:     Rate and Rhythm: Normal rate and regular rhythm.  Pulmonary:     Effort: Pulmonary effort is normal. No respiratory distress.  Abdominal:     Palpations: Abdomen is soft.     Tenderness: There is no abdominal tenderness.   Musculoskeletal:        General: Normal range of motion.     Cervical back: Normal range of motion.   Skin:    General: Skin is warm and dry.   Neurological:     Mental Status: He is alert and oriented to person, place, and time.   Psychiatric:        Mood and Affect: Mood normal.        Behavior: Behavior normal.      LABS: Recent Results (from the past 2160 hours)  POCT Glucose (CBG)     Status: Abnormal   Collection Time: 02/27/24 11:07 AM  Result Value Ref Range   POC Glucose 175 (A) 70 - 99 mg/dl  POCT HgB J8R     Status: Abnormal   Collection Time: 03/02/24  1:08 PM  Result Value Ref Range   Hemoglobin A1C 6.1 (A) 4.0 - 5.6 %   HbA1c POC (<> result, manual entry)     HbA1c, POC (prediabetic range)     HbA1c, POC (controlled diabetic range)          Assessment/Plan: 1. Encounter for general adult medical examination with abnormal findings (Primary) CPE performed, labs previously reviewed, due for colonscopy  2. Essential hypertension Elevated in office, borderline at home and will monitor. May need to increase verapamil  further - hydrochlorothiazide  (HYDRODIURIL ) 25 MG tablet; Take 1 tablet (25 mg total) by mouth daily.  Dispense: 90 tablet; Refill: 0  3. Prediabetes A1c elevated at 6.1 and will monitor. May try for GLP1 coverage in future  4. Elevated fasting glucose - POCT Glucose (CBG) elevated at 175 after 3hrs postprandial so A1c done and elevated  5. Elevated serum creatinine Will recheck labs - Basic Metabolic Panel (BMET)  6. Screening for colorectal cancer - Ambulatory referral to Gastroenterology  7. Morbid obesity with BMI of 45.0-49.9, adult  [facility]) May try wellbutrin  and will refer to bariatric surgery - Amb Referral to Bariatric Surgery - buPROPion  (WELLBUTRIN  XL) 150 MG 24 hr tablet; Take 1 tablet (150 mg total) by mouth daily.  Dispense: 30 tablet; Refill: 2   General Counseling: Ramie verbalizes understanding of the findings of todays visit and agrees with plan of treatment. I have discussed any further diagnostic evaluation that may be needed or ordered today. We also reviewed his medications today. he has been encouraged to call the office with any questions or concerns that should arise related to todays visit.    Counseling:    Orders Placed This Encounter  Procedures   Basic Metabolic Panel (BMET)   Amb Referral to  Bariatric Surgery   Ambulatory referral to Gastroenterology   POCT Glucose (CBG)   POCT HgB A1C    Meds ordered this encounter  Medications   hydrochlorothiazide  (HYDRODIURIL ) 25 MG tablet    Sig: Take 1 tablet (25 mg total) by mouth daily.    Dispense:  90 tablet    Refill:  0   buPROPion  (WELLBUTRIN  XL) 150 MG 24 hr tablet    Sig: Take 1 tablet (150 mg total) by mouth daily.    Dispense:  30 tablet    Refill:  2    This patient was seen by Tinnie Pro, PA-C in collaboration with Dr. Sigrid Bathe as a part of collaborative care agreement.  Total time spent:35 Minutes  Time spent includes review of chart, medications, test results, and follow up plan with the patient.     Sigrid CHRISTELLA Bathe, MD  Internal Medicine

## 2024-03-01 ENCOUNTER — Telehealth: Payer: Self-pay | Admitting: Physician Assistant

## 2024-03-01 NOTE — Telephone Encounter (Signed)
 Awaiting 02/27/24 office notes for GI & Bariatric referral-Daniel Cummings

## 2024-03-02 LAB — POCT GLYCOSYLATED HEMOGLOBIN (HGB A1C): Hemoglobin A1C: 6.1 % — AB (ref 4.0–5.6)

## 2024-03-03 ENCOUNTER — Telehealth: Payer: Self-pay | Admitting: Physician Assistant

## 2024-03-03 NOTE — Telephone Encounter (Signed)
 GI referral sent via [facility] to [facility] per patient request.  Notified patient. Gave pt telephone# [phone]-Daniel Cummings

## 2024-03-04 ENCOUNTER — Telehealth: Payer: Self-pay | Admitting: Physician Assistant

## 2024-03-04 NOTE — Telephone Encounter (Signed)
 Bariatric referral faxed to [facility]; [phone]. Sent message notifying patient. Gave pt telephone [phone]

## 2024-03-20 ENCOUNTER — Other Ambulatory Visit: Payer: Self-pay | Admitting: Physician Assistant

## 2024-03-29 ENCOUNTER — Ambulatory Visit: Admitting: Physician Assistant

## 2024-04-21 ENCOUNTER — Other Ambulatory Visit: Payer: Self-pay | Admitting: Physician Assistant

## 2024-05-25 ENCOUNTER — Telehealth: Payer: Self-pay | Admitting: Physician Assistant

## 2024-05-25 NOTE — Telephone Encounter (Signed)
 Per Delon w/ [facility], referral has been closed due to patient not returning calls -Andree

## 2024-06-21 ENCOUNTER — Other Ambulatory Visit: Payer: Self-pay | Admitting: Nurse Practitioner

## 2024-06-21 ENCOUNTER — Encounter: Payer: Self-pay | Admitting: Nurse Practitioner

## 2024-06-21 ENCOUNTER — Ambulatory Visit: Admitting: Nurse Practitioner

## 2024-06-21 VITALS — BP 135/85 | HR 89 | Temp 97.5°F | Resp 16 | Ht 73.0 in | Wt 349.4 lb

## 2024-06-21 DIAGNOSIS — E538 Deficiency of other specified B group vitamins: Secondary | ICD-10-CM

## 2024-06-21 DIAGNOSIS — M5441 Lumbago with sciatica, right side: Secondary | ICD-10-CM

## 2024-06-21 DIAGNOSIS — M62830 Muscle spasm of back: Secondary | ICD-10-CM

## 2024-06-21 DIAGNOSIS — I10 Essential (primary) hypertension: Secondary | ICD-10-CM

## 2024-06-21 MED ORDER — METHOCARBAMOL 500 MG PO TABS: 500.0000 mg | ORAL_TABLET | Freq: Four times a day (QID) | ORAL | 2 refills | Status: AC | PRN

## 2024-06-21 NOTE — Progress Notes (Signed)
 [facility] [address] [place], [place] [address]  Internal MEDICINE  Office Visit Note  Patient Name: Daniel Cummings  [id number]  [id number]  Date of Service: 06/21/2024  Chief Complaint  Patient presents with   Acute Visit    Low back spasms     HPI Daniel Cummings presents for an acute sick visit for low back pain with spasms.  --onset of back pain and spasms last Thursday after lifting weights.  Low back pain radiating down right leg.  Low B12 -- asking for B12 injection but has not had his B12 checked in a long time.    Current Medication:  Outpatient Encounter Medications as of 06/21/2024  Medication Sig   methocarbamol (ROBAXIN) 500 MG tablet Take 1-2 tablets (500-1,000 mg total) by mouth every 6 (six) hours as needed for muscle spasms.   buPROPion  (WELLBUTRIN  XL) 150 MG 24 hr tablet Take 1 tablet (150 mg total) by mouth daily.   doxycycline  (VIBRA -TABS) 100 MG tablet Take 1 tablet (100 mg total) by mouth 2 (two) times daily.   hydrochlorothiazide  (HYDRODIURIL ) 25 MG tablet Take 1 tablet (25 mg total) by mouth daily.   NEEDLE, DISP, 18 G (BD SAFETYGLIDE NEEDLE) 18G X 1-1/2 MISC Draw medication up with   NEEDLE, DISP, 21 G (BD ECLIPSE NEEDLE) 21G X 1-1/2 MISC Use one needle every 2 weeks to inject testosterone  into the muscle.   sildenafil  (VIAGRA ) 100 MG tablet Take 1 tablet 1 hour prior to intercourse   Syringe, Disposable, (2-3CC SYRINGE) 3 ML MISC Use one syringe every 2 weeks with testosterone  injection.   testosterone  cypionate (DEPOTESTOSTERONE CYPIONATE) 200 MG/ML injection INJECT 1 ML (200 MG TOTAL) INTO THE MUSCLE EVERY 14 DAYS   Verapamil  HCl CR 300 MG CP24 TAKE 1 CAPSULE BY MOUTH EVERY DAY   No facility-administered encounter medications on file as of 06/21/2024.      Medical History: Past Medical History:  Diagnosis Date   Hypertension      Vital Signs: BP 135/85 Comment: 146/90  Pulse 89   Temp (!) 97.5 F (36.4 C)   Resp 16    Ht 6' 1 (1.854 m)   Wt (!) 349 lb 6.4 oz (158.5 kg)   SpO2 96%   BMI 46.10 kg/m    Review of Systems  Constitutional:  Positive for fatigue and unexpected weight change.  Respiratory: Negative.  Negative for cough, chest tightness, shortness of breath and wheezing.   Cardiovascular: Negative.  Negative for chest pain and palpitations.  Musculoskeletal:  Positive for arthralgias, back pain and myalgias.    Physical Exam Vitals reviewed.  Constitutional:      General: He is not in acute distress.    Appearance: Normal appearance. He is obese. He is not ill-appearing.  HENT:     Head: Normocephalic and atraumatic.  Eyes:     Pupils: Pupils are equal, round, and reactive to light.  Cardiovascular:     Rate and Rhythm: Normal rate and regular rhythm.  Pulmonary:     Effort: Pulmonary effort is normal. No respiratory distress.  Musculoskeletal:     Lumbar back: Spasms and tenderness present. Decreased range of motion.  Neurological:     Mental Status: He is alert and oriented to person, place, and time.  Psychiatric:        Mood and Affect: Mood normal.        Behavior: Behavior normal.       Assessment/Plan: 1. Acute right-sided low back pain with  right-sided sciatica (Primary) Take methocarbamol as prescribed  - methocarbamol (ROBAXIN) 500 MG tablet; Take 1-2 tablets (500-1,000 mg total) by mouth every 6 (six) hours as needed for muscle spasms.  Dispense: 60 tablet; Refill: 2  2. Spasm of muscle of lower back Take methocarbamol as prescribed  - methocarbamol (ROBAXIN) 500 MG tablet; Take 1-2 tablets (500-1,000 mg total) by mouth every 6 (six) hours as needed for muscle spasms.  Dispense: 60 tablet; Refill: 2  3. B12 deficiency Repeat B12 and folate level - B12 and Folate Panel   General Counseling: Daniel Cummings verbalizes understanding of the findings of todays visit and agrees with plan of treatment. I have discussed any further diagnostic evaluation that may be needed  or ordered today. We also reviewed his medications today. he has been encouraged to call the office with any questions or concerns that should arise related to todays visit.    Counseling:    Orders Placed This Encounter  Procedures   B12 and Folate Panel    Meds ordered this encounter  Medications   methocarbamol (ROBAXIN) 500 MG tablet    Sig: Take 1-2 tablets (500-1,000 mg total) by mouth every 6 (six) hours as needed for muscle spasms.    Dispense:  60 tablet    Refill:  2    Fill new script today.    Return if symptoms worsen or fail to improve, for please write work note to be out 2 days return on wednesday. .  [place] Controlled Substance Database was reviewed by me for overdose risk score (ORS)  Time spent:30 Minutes Time spent with patient included reviewing progress notes, labs, imaging studies, and discussing plan for follow up.   This patient was seen by Mardy Maxin, FNP-C in collaboration with Dr. Sigrid Bathe as a part of collaborative care agreement.  Caylon Saine R. Maxin, MSN, FNP-C Internal Medicine

## 2024-06-22 ENCOUNTER — Telehealth: Payer: Self-pay

## 2024-06-23 ENCOUNTER — Other Ambulatory Visit: Payer: Self-pay

## 2024-06-23 MED ORDER — IBUPROFEN 800 MG PO TABS: 800.0000 mg | ORAL_TABLET | Freq: Three times a day (TID) | ORAL | 0 refills | Status: AC | PRN

## 2024-06-23 NOTE — Telephone Encounter (Signed)
 Pt advised that we sent med

## 2024-07-12 ENCOUNTER — Encounter: Payer: Self-pay | Admitting: Nurse Practitioner

## 2024-09-05 ENCOUNTER — Other Ambulatory Visit: Payer: Self-pay | Admitting: Physician Assistant

## 2024-09-05 DIAGNOSIS — I10 Essential (primary) hypertension: Secondary | ICD-10-CM

## 2025-02-28 ENCOUNTER — Encounter: Admitting: Physician Assistant
